# Patient Record
Sex: Female | Born: 1940 | Race: White | Hispanic: No | Smoking: Never smoker
Health system: Southern US, Community
[De-identification: ages and names within clinical notes are randomized; demographics above are authoritative.]

## PROBLEM LIST (undated history)

## (undated) DIAGNOSIS — E785 Hyperlipidemia, unspecified: Secondary | ICD-10-CM

## (undated) DIAGNOSIS — IMO0001 Reserved for inherently not codable concepts without codable children: Secondary | ICD-10-CM

## (undated) DIAGNOSIS — M549 Dorsalgia, unspecified: Secondary | ICD-10-CM

## (undated) DIAGNOSIS — R350 Frequency of micturition: Secondary | ICD-10-CM

## (undated) DIAGNOSIS — R112 Nausea with vomiting, unspecified: Secondary | ICD-10-CM

## (undated) DIAGNOSIS — M810 Age-related osteoporosis without current pathological fracture: Secondary | ICD-10-CM

## (undated) DIAGNOSIS — M1990 Unspecified osteoarthritis, unspecified site: Secondary | ICD-10-CM

## (undated) DIAGNOSIS — J189 Pneumonia, unspecified organism: Secondary | ICD-10-CM

## (undated) DIAGNOSIS — M2550 Pain in unspecified joint: Secondary | ICD-10-CM

## (undated) DIAGNOSIS — K922 Gastrointestinal hemorrhage, unspecified: Secondary | ICD-10-CM

## (undated) DIAGNOSIS — Z9889 Other specified postprocedural states: Secondary | ICD-10-CM

## (undated) DIAGNOSIS — Z8619 Personal history of other infectious and parasitic diseases: Secondary | ICD-10-CM

## (undated) DIAGNOSIS — G8929 Other chronic pain: Secondary | ICD-10-CM

## (undated) DIAGNOSIS — K219 Gastro-esophageal reflux disease without esophagitis: Secondary | ICD-10-CM

## (undated) DIAGNOSIS — R413 Other amnesia: Secondary | ICD-10-CM

## (undated) HISTORY — PX: TUBAL LIGATION: SHX77

## (undated) HISTORY — PX: COLONOSCOPY: SHX174

---

## 2004-09-28 ENCOUNTER — Encounter: Admission: RE | Admit: 2004-09-28 | Discharge: 2004-09-28 | Payer: Self-pay | Admitting: Family Medicine

## 2004-10-06 ENCOUNTER — Encounter: Admission: RE | Admit: 2004-10-06 | Discharge: 2004-10-06 | Payer: Self-pay | Admitting: Family Medicine

## 2005-09-30 ENCOUNTER — Encounter: Admission: RE | Admit: 2005-09-30 | Discharge: 2005-09-30 | Payer: Self-pay | Admitting: Family Medicine

## 2006-10-09 ENCOUNTER — Ambulatory Visit (HOSPITAL_COMMUNITY): Admission: RE | Admit: 2006-10-09 | Discharge: 2006-10-09 | Payer: Self-pay | Admitting: Gastroenterology

## 2007-08-30 ENCOUNTER — Encounter: Admission: RE | Admit: 2007-08-30 | Discharge: 2007-08-30 | Payer: Self-pay | Admitting: Family Medicine

## 2008-12-05 ENCOUNTER — Encounter: Admission: RE | Admit: 2008-12-05 | Discharge: 2008-12-05 | Payer: Self-pay | Admitting: Family Medicine

## 2010-02-02 ENCOUNTER — Encounter: Admission: RE | Admit: 2010-02-02 | Discharge: 2010-02-02 | Payer: Self-pay | Admitting: Family Medicine

## 2010-08-17 NOTE — Op Note (Signed)
Stephanie Ingram, Stephanie Ingram             ACCOUNT NO.:  [id number]   MEDICAL RECORD NO.:  [id number]          PATIENT TYPE:  AMB   LOCATION:  ENDO                         FACILITY:  [facility]   PHYSICIAN:  Anselmo Rod, M.D.  DATE OF BIRTH:  [birth year]   DATE OF PROCEDURE:  10/09/2006  DATE OF DISCHARGE:                               OPERATIVE REPORT   PROCEDURE PERFORMED:  Colorectal cancer screening.   ENDOSCOPIST:  Anselmo Rod, M.D.   INSTRUMENT USED:  Pentax video colonoscope.   INDICATIONS FOR PROCEDURE:  A 70 year old white female undergoing a  screening colonoscopy to rule out colonic polyps, masses, etc.   PREPROCEDURE PREPARATION:  Informed consent was procured from the  patient.  The patient was fasted for 8 hours prior to the procedure.  Risks and benefits of the procedure including a 10% miss rate of cancer  and polyp were discussed with the patient as well.   PREPROCEDURE PHYSICAL:  The patient had stable vital signs.  NECK: Supple.  CHEST:  Clear to auscultation.  S1, S2 regular.  ABDOMEN:  Soft with normal bowel sounds.   DESCRIPTION OF PROCEDURE:  The patient was placed in the left lateral  decubitus position and sedated with 75 mcg of fentanyl and 10 mg of  Versed given intravenously in slow incremental doses.  Once the patient  was adequately sedate and maintained on low-flow oxygen and continuous  cardiac monitoring, the Pentax video colonoscope was advanced from the  rectum to the cecum.  The appendiceal orifice and ileocecal valve were  visualized and photographed.  There was some residual stool in the right  colon.  Multiple washes were done.  Small lesions could be missed.  The  terminal ileum appeared healthy and without lesions.  There was a  evidence of sigmoid diverticulosis.  No masses or polyps were seen.  Retroflexion in the rectum revealed no abnormalities.  The patient  tolerated the procedure well without immediate complications.  Multiple  washes  were done but in spite of this, small lesions could be missed as  the patient had somewhat adherent stool in the dependent areas of the  colon.   IMPRESSION:  1. Normal colon up to the terminal ileum except for sigmoid      diverticulosis.  2. Some residual stool in the dependent areas of the colon.  Small      lesions could be missed.   RECOMMENDATIONS:  1. Continue a high-fiber diet with liberal fluid intake.  2. Brochures on diverticulosis been given to the patient for      education.  3. Repeat colonoscopy has been advised in the next 5 years.  The      patient has any abnormal symptoms in the interim, she should      contact the office immediately for further recommendations.  4. Outpatient follow-up as need arises. to the transcriptionist under      description of procedure.      Anselmo Rod, M.D.  Electronically Signed     JNM/MEDQ  D:  10/09/2006  T:  10/10/2006  Job:  [id number]  cc:   Marjory Lies, M.D.  Fax: [phone]

## 2011-02-16 ENCOUNTER — Other Ambulatory Visit: Payer: Self-pay | Admitting: Family Medicine

## 2011-02-16 DIAGNOSIS — Z1231 Encounter for screening mammogram for malignant neoplasm of breast: Secondary | ICD-10-CM

## 2011-03-14 ENCOUNTER — Ambulatory Visit
Admission: RE | Admit: 2011-03-14 | Discharge: 2011-03-14 | Disposition: A | Discharge status: Home / Self Care | Payer: Medicare Other | Source: Ambulatory Visit | Attending: Family Medicine | Admitting: Family Medicine

## 2011-03-14 DIAGNOSIS — Z1231 Encounter for screening mammogram for malignant neoplasm of breast: Secondary | ICD-10-CM

## 2012-07-24 ENCOUNTER — Other Ambulatory Visit: Payer: Self-pay | Admitting: Family Medicine

## 2012-07-24 DIAGNOSIS — M810 Age-related osteoporosis without current pathological fracture: Secondary | ICD-10-CM

## 2012-07-27 ENCOUNTER — Other Ambulatory Visit: Payer: Medicare Other

## 2013-02-04 ENCOUNTER — Other Ambulatory Visit: Payer: Self-pay | Admitting: Family Medicine

## 2013-02-04 DIAGNOSIS — Z1231 Encounter for screening mammogram for malignant neoplasm of breast: Secondary | ICD-10-CM

## 2013-03-12 ENCOUNTER — Ambulatory Visit
Admission: RE | Admit: 2013-03-12 | Discharge: 2013-03-12 | Disposition: A | Discharge status: Home / Self Care | Payer: Medicare Other | Source: Ambulatory Visit | Attending: Family Medicine | Admitting: Family Medicine

## 2013-03-12 DIAGNOSIS — M810 Age-related osteoporosis without current pathological fracture: Secondary | ICD-10-CM

## 2013-03-12 DIAGNOSIS — Z1231 Encounter for screening mammogram for malignant neoplasm of breast: Secondary | ICD-10-CM

## 2013-09-05 ENCOUNTER — Other Ambulatory Visit (HOSPITAL_COMMUNITY): Payer: Self-pay | Admitting: Orthopaedic Surgery

## 2013-09-06 ENCOUNTER — Encounter (HOSPITAL_COMMUNITY): Payer: Self-pay | Admitting: Pharmacist

## 2013-09-09 ENCOUNTER — Encounter (HOSPITAL_COMMUNITY): Payer: Self-pay

## 2013-09-09 ENCOUNTER — Ambulatory Visit (HOSPITAL_COMMUNITY)
Admission: RE | Admit: 2013-09-09 | Discharge: 2013-09-09 | Disposition: A | Discharge status: Home / Self Care | Payer: Medicare HMO | Source: Ambulatory Visit | Attending: Orthopaedic Surgery | Admitting: Orthopaedic Surgery

## 2013-09-09 ENCOUNTER — Encounter (HOSPITAL_COMMUNITY)
Admission: RE | Admit: 2013-09-09 | Discharge: 2013-09-09 | Disposition: A | Discharge status: Home / Self Care | Payer: Medicare HMO | Source: Ambulatory Visit | Attending: Orthopaedic Surgery | Admitting: Orthopaedic Surgery

## 2013-09-09 DIAGNOSIS — E785 Hyperlipidemia, unspecified: Secondary | ICD-10-CM | POA: Insufficient documentation

## 2013-09-09 DIAGNOSIS — Z01812 Encounter for preprocedural laboratory examination: Secondary | ICD-10-CM | POA: Insufficient documentation

## 2013-09-09 DIAGNOSIS — K219 Gastro-esophageal reflux disease without esophagitis: Secondary | ICD-10-CM | POA: Insufficient documentation

## 2013-09-09 DIAGNOSIS — Z01818 Encounter for other preprocedural examination: Secondary | ICD-10-CM | POA: Insufficient documentation

## 2013-09-09 DIAGNOSIS — Z01810 Encounter for preprocedural cardiovascular examination: Secondary | ICD-10-CM | POA: Insufficient documentation

## 2013-09-09 HISTORY — DX: Pneumonia, unspecified organism: J18.9

## 2013-09-09 HISTORY — DX: Other chronic pain: G89.29

## 2013-09-09 HISTORY — DX: Gastro-esophageal reflux disease without esophagitis: K21.9

## 2013-09-09 HISTORY — DX: Age-related osteoporosis without current pathological fracture: M81.0

## 2013-09-09 HISTORY — DX: Personal history of other infectious and parasitic diseases: Z86.19

## 2013-09-09 HISTORY — DX: Hyperlipidemia, unspecified: E78.5

## 2013-09-09 HISTORY — DX: Frequency of micturition: R35.0

## 2013-09-09 HISTORY — DX: Dorsalgia, unspecified: M54.9

## 2013-09-09 HISTORY — DX: Pain in unspecified joint: M25.50

## 2013-09-09 LAB — URINE MICROSCOPIC-ADD ON

## 2013-09-09 LAB — URINALYSIS, ROUTINE W REFLEX MICROSCOPIC
BILIRUBIN URINE: NEGATIVE
GLUCOSE, UA: NEGATIVE mg/dL
KETONES UR: NEGATIVE mg/dL
Nitrite: POSITIVE — AB
Protein, ur: NEGATIVE mg/dL
SPECIFIC GRAVITY, URINE: 1.018 (ref 1.005–1.030)
Urobilinogen, UA: 0.2 mg/dL (ref 0.0–1.0)
pH: 5.5 (ref 5.0–8.0)

## 2013-09-09 LAB — COMPREHENSIVE METABOLIC PANEL
ALBUMIN: 3.9 g/dL (ref 3.5–5.2)
ALT: 18 U/L (ref 0–35)
AST: 22 U/L (ref 0–37)
Alkaline Phosphatase: 43 U/L (ref 39–117)
BILIRUBIN TOTAL: 0.3 mg/dL (ref 0.3–1.2)
BUN: 14 mg/dL (ref 6–23)
CHLORIDE: 102 meq/L (ref 96–112)
CO2: 26 meq/L (ref 19–32)
Calcium: 9.6 mg/dL (ref 8.4–10.5)
Creatinine, Ser: 0.95 mg/dL (ref 0.50–1.10)
GFR calc Af Amer: 67 mL/min — ABNORMAL LOW (ref >90)
GFR, EST NON AFRICAN AMERICAN: 58 mL/min — AB (ref >90)
Glucose, Bld: 86 mg/dL (ref 70–99)
Potassium: 4.3 mEq/L (ref 3.7–5.3)
SODIUM: 141 meq/L (ref 137–147)
Total Protein: 7.5 g/dL (ref 6.0–8.3)

## 2013-09-09 LAB — CBC
HCT: 40.5 % (ref 36.0–46.0)
Hemoglobin: 13.2 g/dL (ref 12.0–15.0)
MCH: 31.1 pg (ref 26.0–34.0)
MCHC: 32.6 g/dL (ref 30.0–36.0)
MCV: 95.3 fL (ref 78.0–100.0)
PLATELETS: 280 10*3/uL (ref 150–400)
RBC: 4.25 MIL/uL (ref 3.87–5.11)
RDW: 13.8 % (ref 11.5–15.5)
WBC: 6.7 10*3/uL (ref 4.0–10.5)

## 2013-09-09 LAB — SURGICAL PCR SCREEN
MRSA, PCR: NEGATIVE
Staphylococcus aureus: NEGATIVE

## 2013-09-09 LAB — PROTIME-INR
INR: 0.96 (ref 0.00–1.49)
Prothrombin Time: 12.6 seconds (ref 11.6–15.2)

## 2013-09-09 NOTE — Pre-Procedure Instructions (Signed)
Jennelle BEILA JENERETTE  09/09/2013   Your procedure is scheduled on:  Wed,June 10 @ 12:30 PM  Report to [facility]  at 10:30 AM.  Call this number if you have problems the morning of surgery: [phone]   Remember:   Do not eat food or drink liquids after midnight.   Take these medicines the morning of surgery with A SIP OF WATER: Pain Pill(if needed)              Stop taking your Aspirin. No Goody's,BC's,Aleve,Ibuprofen,Fish Oil,or any Herbal Medications   Do not wear jewelry, make-up or nail polish.  Do not wear lotions, powders, or perfumes. You may wear deodorant.  Do not shave 48 hours prior to surgery.   Do not bring valuables to the hospital.  [facility] is not responsible                  for any belongings or valuables.               Contacts, dentures or bridgework may not be worn into surgery.  Leave suitcase in the car. After surgery it may be brought to your room.  For patients admitted to the hospital, discharge time is determined by your                treatment team.               Patients discharged the day of surgery will not be allowed to drive  home.    Special Instructions:  [facility] - Preparing for Surgery  Before surgery, you can play an important role.  Because skin is not sterile, your skin needs to be as free of germs as possible.  You can reduce the number of germs on you skin by washing with CHG (chlorahexidine gluconate) soap before surgery.  CHG is an antiseptic cleaner which kills germs and bonds with the skin to continue killing germs even after washing.  Please DO NOT use if you have an allergy to CHG or antibacterial soaps.  If your skin becomes reddened/irritated stop using the CHG and inform your nurse when you arrive at Short Stay.  Do not shave (including legs and underarms) for at least 48 hours prior to the first CHG shower.  You may shave your face.  Please follow these instructions carefully:   1.  Shower with CHG Soap the night  before surgery and the                                morning of Surgery.  2.  If you choose to wash your hair, wash your hair first as usual with your       normal shampoo.  3.  After you shampoo, rinse your hair and body thoroughly to remove the                      Shampoo.  4.  Use CHG as you would any other liquid soap.  You can apply chg directly       to the skin and wash gently with scrungie or a clean washcloth.  5.  Apply the CHG Soap to your body ONLY FROM THE NECK DOWN.        Do not use on open wounds or open sores.  Avoid contact with your eyes,       ears, mouth and genitals (  private parts).  Wash genitals (private parts)       with your normal soap.  6.  Wash thoroughly, paying special attention to the area where your surgery        will be performed.  7.  Thoroughly rinse your body with warm water from the neck down.  8.  DO NOT shower/wash with your normal soap after using and rinsing off       the CHG Soap.  9.  Pat yourself dry with a clean towel.            10.  Wear clean pajamas.            11.  Place clean sheets on your bed the night of your first shower and do not        sleep with pets.  Day of Surgery  Do not apply any lotions/deoderants the morning of surgery.  Please wear clean clothes to the hospital/surgery center.     Please read over the following fact sheets that you were given: Pain Booklet, Coughing and Deep Breathing, MRSA Information and Surgical Site Infection Prevention

## 2013-09-09 NOTE — Progress Notes (Signed)
Pt doesn't have a cardiologist  Denies ever having an echo/stress test/heart cath  Medical Md is Dr.Fred Andrey Campanile in [place]  Denies EKG or CXR in past yr

## 2013-09-10 MED ORDER — CEFAZOLIN SODIUM-DEXTROSE 2-3 GM-% IV SOLR
2.0000 g | INTRAVENOUS | Status: AC
  Administered 2013-09-11: 2 g via INTRAVENOUS
  Filled 2013-09-10: qty 50

## 2013-09-11 ENCOUNTER — Ambulatory Visit (HOSPITAL_COMMUNITY): Payer: Medicare HMO | Admitting: Critical Care Medicine

## 2013-09-11 ENCOUNTER — Ambulatory Visit (HOSPITAL_COMMUNITY): Payer: Medicare HMO

## 2013-09-11 ENCOUNTER — Encounter (HOSPITAL_COMMUNITY): Payer: Medicare HMO | Admitting: Critical Care Medicine

## 2013-09-11 ENCOUNTER — Observation Stay (HOSPITAL_COMMUNITY)
Admission: RE | Admit: 2013-09-11 | Discharge: 2013-09-12 | Disposition: A | Discharge status: Home / Self Care | Payer: Medicare HMO | Source: Ambulatory Visit | Attending: Orthopaedic Surgery | Admitting: Orthopaedic Surgery

## 2013-09-11 ENCOUNTER — Encounter (HOSPITAL_COMMUNITY)
Admission: RE | Disposition: A | Discharge status: Home / Self Care | Payer: Self-pay | Source: Ambulatory Visit | Attending: Orthopaedic Surgery

## 2013-09-11 ENCOUNTER — Encounter (HOSPITAL_COMMUNITY): Payer: Self-pay | Admitting: *Deleted

## 2013-09-11 DIAGNOSIS — E785 Hyperlipidemia, unspecified: Secondary | ICD-10-CM | POA: Insufficient documentation

## 2013-09-11 DIAGNOSIS — N390 Urinary tract infection, site not specified: Secondary | ICD-10-CM | POA: Diagnosis present

## 2013-09-11 DIAGNOSIS — M810 Age-related osteoporosis without current pathological fracture: Secondary | ICD-10-CM | POA: Insufficient documentation

## 2013-09-11 DIAGNOSIS — R350 Frequency of micturition: Secondary | ICD-10-CM | POA: Insufficient documentation

## 2013-09-11 DIAGNOSIS — K219 Gastro-esophageal reflux disease without esophagitis: Secondary | ICD-10-CM | POA: Insufficient documentation

## 2013-09-11 DIAGNOSIS — M5126 Other intervertebral disc displacement, lumbar region: Principal | ICD-10-CM | POA: Diagnosis present

## 2013-09-11 HISTORY — PX: LUMBAR LAMINECTOMY: SHX95

## 2013-09-11 SURGERY — MICRODISCECTOMY LUMBAR LAMINECTOMY
Anesthesia: General | Site: Back | Laterality: Right

## 2013-09-11 MED ORDER — MIDAZOLAM HCL 5 MG/5ML IJ SOLN
INTRAMUSCULAR | Status: DC | PRN
Start: 2013-09-11 — End: 2013-09-11
  Administered 2013-09-11: 2 mg via INTRAVENOUS

## 2013-09-11 MED ORDER — MORPHINE SULFATE 2 MG/ML IJ SOLN: 1.0000 mg | INTRAMUSCULAR | Status: DC | PRN

## 2013-09-11 MED ORDER — OXYCODONE-ACETAMINOPHEN 5-325 MG PO TABS
1.0000 | ORAL_TABLET | ORAL | Status: DC | PRN
  Administered 2013-09-11 – 2013-09-12 (×3): 2 via ORAL
  Filled 2013-09-11 (×3): qty 2

## 2013-09-11 MED ORDER — ONDANSETRON HCL 4 MG/2ML IJ SOLN: 4.0000 mg | INTRAMUSCULAR | Status: DC | PRN

## 2013-09-11 MED ORDER — SODIUM CHLORIDE 0.9 % IJ SOLN
3.0000 mL | Freq: Two times a day (BID) | INTRAMUSCULAR | Status: DC
  Administered 2013-09-11: 3 mL via INTRAVENOUS

## 2013-09-11 MED ORDER — FENTANYL CITRATE 0.05 MG/ML IJ SOLN
INTRAMUSCULAR | Status: DC | PRN
  Administered 2013-09-11: 25 ug via INTRAVENOUS
  Administered 2013-09-11 (×2): 50 ug via INTRAVENOUS

## 2013-09-11 MED ORDER — FENTANYL CITRATE 0.05 MG/ML IJ SOLN
INTRAMUSCULAR | Status: AC
  Filled 2013-09-11: qty 5

## 2013-09-11 MED ORDER — BISACODYL 10 MG RE SUPP: 10.0000 mg | Freq: Every day | RECTAL | Status: DC | PRN

## 2013-09-11 MED ORDER — FLEET ENEMA 7-19 GM/118ML RE ENEM: 1.0000 | ENEMA | Freq: Once | RECTAL | Status: AC | PRN

## 2013-09-11 MED ORDER — HYDROCODONE-ACETAMINOPHEN 5-325 MG PO TABS: 1.0000 | ORAL_TABLET | ORAL | Status: DC | PRN

## 2013-09-11 MED ORDER — MENTHOL 3 MG MT LOZG
1.0000 | LOZENGE | OROMUCOSAL | Status: DC | PRN
  Administered 2013-09-11: 3 mg via ORAL
  Filled 2013-09-11: qty 9

## 2013-09-11 MED ORDER — ASPIRIN EC 81 MG PO TBEC
81.0000 mg | DELAYED_RELEASE_TABLET | Freq: Every day | ORAL | Status: DC
  Administered 2013-09-11 – 2013-09-12 (×2): 81 mg via ORAL
  Filled 2013-09-11 (×2): qty 1

## 2013-09-11 MED ORDER — PHENYLEPHRINE HCL 10 MG/ML IJ SOLN
INTRAMUSCULAR | Status: DC | PRN
  Administered 2013-09-11 (×2): 80 ug via INTRAVENOUS
  Administered 2013-09-11: 40 ug via INTRAVENOUS
  Administered 2013-09-11 (×5): 80 ug via INTRAVENOUS

## 2013-09-11 MED ORDER — OXYCODONE HCL 5 MG/5ML PO SOLN: 5.0000 mg | Freq: Once | ORAL | Status: DC | PRN

## 2013-09-11 MED ORDER — FENOFIBRATE 160 MG PO TABS
160.0000 mg | ORAL_TABLET | Freq: Every day | ORAL | Status: DC
  Administered 2013-09-11 – 2013-09-12 (×2): 160 mg via ORAL
  Filled 2013-09-11 (×2): qty 1

## 2013-09-11 MED ORDER — HYDROMORPHONE HCL PF 1 MG/ML IJ SOLN
0.2500 mg | INTRAMUSCULAR | Status: DC | PRN
  Administered 2013-09-11 (×2): 0.25 mg via INTRAVENOUS
  Administered 2013-09-11: 0.5 mg via INTRAVENOUS

## 2013-09-11 MED ORDER — SUCCINYLCHOLINE CHLORIDE 20 MG/ML IJ SOLN
INTRAMUSCULAR | Status: AC
  Filled 2013-09-11: qty 1

## 2013-09-11 MED ORDER — PROMETHAZINE HCL 25 MG/ML IJ SOLN: 6.2500 mg | INTRAMUSCULAR | Status: DC | PRN

## 2013-09-11 MED ORDER — METHOCARBAMOL 1000 MG/10ML IJ SOLN
500.0000 mg | Freq: Four times a day (QID) | INTRAMUSCULAR | Status: DC | PRN
  Filled 2013-09-11: qty 5

## 2013-09-11 MED ORDER — GLYCOPYRROLATE 0.2 MG/ML IJ SOLN
INTRAMUSCULAR | Status: AC
  Filled 2013-09-11: qty 2

## 2013-09-11 MED ORDER — ARTIFICIAL TEARS OP OINT
TOPICAL_OINTMENT | OPHTHALMIC | Status: DC | PRN
  Administered 2013-09-11: 1 via OPHTHALMIC

## 2013-09-11 MED ORDER — OXYCODONE HCL 5 MG PO TABS: 5.0000 mg | ORAL_TABLET | Freq: Once | ORAL | Status: DC | PRN

## 2013-09-11 MED ORDER — PHENOL 1.4 % MT LIQD: 1.0000 | OROMUCOSAL | Status: DC | PRN

## 2013-09-11 MED ORDER — NEOSTIGMINE METHYLSULFATE 10 MG/10ML IV SOLN
INTRAVENOUS | Status: DC | PRN
  Administered 2013-09-11: 3 mg via INTRAVENOUS

## 2013-09-11 MED ORDER — HYDROMORPHONE HCL PF 1 MG/ML IJ SOLN
INTRAMUSCULAR | Status: AC
  Administered 2013-09-11: 0.25 mg via INTRAVENOUS
  Filled 2013-09-11: qty 1

## 2013-09-11 MED ORDER — ACETAMINOPHEN 650 MG RE SUPP: 650.0000 mg | RECTAL | Status: DC | PRN

## 2013-09-11 MED ORDER — MIDAZOLAM HCL 2 MG/2ML IJ SOLN
INTRAMUSCULAR | Status: AC
  Filled 2013-09-11: qty 2

## 2013-09-11 MED ORDER — GLYCOPYRROLATE 0.2 MG/ML IJ SOLN
INTRAMUSCULAR | Status: DC | PRN
  Administered 2013-09-11: 0.4 mg via INTRAVENOUS

## 2013-09-11 MED ORDER — PROPOFOL 10 MG/ML IV BOLUS
INTRAVENOUS | Status: AC
  Filled 2013-09-11: qty 20

## 2013-09-11 MED ORDER — PHENYLEPHRINE 40 MCG/ML (10ML) SYRINGE FOR IV PUSH (FOR BLOOD PRESSURE SUPPORT)
PREFILLED_SYRINGE | INTRAVENOUS | Status: AC
  Filled 2013-09-11: qty 10

## 2013-09-11 MED ORDER — BUPIVACAINE HCL (PF) 0.25 % IJ SOLN
INTRAMUSCULAR | Status: AC
  Filled 2013-09-11: qty 30

## 2013-09-11 MED ORDER — LIDOCAINE HCL (CARDIAC) 20 MG/ML IV SOLN
INTRAVENOUS | Status: DC | PRN
  Administered 2013-09-11: 80 mg via INTRAVENOUS

## 2013-09-11 MED ORDER — KETOROLAC TROMETHAMINE 15 MG/ML IJ SOLN
INTRAMUSCULAR | Status: AC
  Administered 2013-09-11: 15 mg
  Filled 2013-09-11: qty 1

## 2013-09-11 MED ORDER — ONDANSETRON HCL 4 MG/2ML IJ SOLN
INTRAMUSCULAR | Status: DC | PRN
  Administered 2013-09-11: 4 mg via INTRAVENOUS

## 2013-09-11 MED ORDER — LACTATED RINGERS IV SOLN
INTRAVENOUS | Status: DC
  Administered 2013-09-11 (×2): via INTRAVENOUS

## 2013-09-11 MED ORDER — KETOROLAC TROMETHAMINE 30 MG/ML IJ SOLN
15.0000 mg | Freq: Four times a day (QID) | INTRAMUSCULAR | Status: DC
  Administered 2013-09-11 – 2013-09-12 (×3): 15 mg via INTRAVENOUS
  Filled 2013-09-11 (×3): qty 1

## 2013-09-11 MED ORDER — 0.9 % SODIUM CHLORIDE (POUR BTL) OPTIME
TOPICAL | Status: DC | PRN
  Administered 2013-09-11: 1000 mL

## 2013-09-11 MED ORDER — OXYCODONE-ACETAMINOPHEN 5-325 MG PO TABS: 1.0000 | ORAL_TABLET | ORAL | Status: DC | PRN

## 2013-09-11 MED ORDER — ACETAMINOPHEN 325 MG PO TABS: 650.0000 mg | ORAL_TABLET | ORAL | Status: DC | PRN

## 2013-09-11 MED ORDER — DOCUSATE SODIUM 100 MG PO CAPS
100.0000 mg | ORAL_CAPSULE | Freq: Two times a day (BID) | ORAL | Status: DC
  Administered 2013-09-11 – 2013-09-12 (×2): 100 mg via ORAL
  Filled 2013-09-11 (×3): qty 1

## 2013-09-11 MED ORDER — PHENYLEPHRINE HCL 10 MG/ML IJ SOLN
10.0000 mg | INTRAVENOUS | Status: DC | PRN
  Administered 2013-09-11: 20 ug/min via INTRAVENOUS

## 2013-09-11 MED ORDER — LIDOCAINE HCL (CARDIAC) 20 MG/ML IV SOLN
INTRAVENOUS | Status: AC
  Filled 2013-09-11: qty 5

## 2013-09-11 MED ORDER — ROCURONIUM BROMIDE 100 MG/10ML IV SOLN
INTRAVENOUS | Status: DC | PRN
  Administered 2013-09-11: 40 mg via INTRAVENOUS

## 2013-09-11 MED ORDER — NEOSTIGMINE METHYLSULFATE 10 MG/10ML IV SOLN
INTRAVENOUS | Status: AC
  Filled 2013-09-11: qty 1

## 2013-09-11 MED ORDER — SENNOSIDES-DOCUSATE SODIUM 8.6-50 MG PO TABS: 1.0000 | ORAL_TABLET | Freq: Every evening | ORAL | Status: DC | PRN

## 2013-09-11 MED ORDER — BUPIVACAINE HCL (PF) 0.25 % IJ SOLN
INTRAMUSCULAR | Status: DC | PRN
  Administered 2013-09-11: 10 mL

## 2013-09-11 MED ORDER — METHOCARBAMOL 500 MG PO TABS
500.0000 mg | ORAL_TABLET | Freq: Four times a day (QID) | ORAL | Status: DC | PRN
  Administered 2013-09-11: 500 mg via ORAL

## 2013-09-11 MED ORDER — SODIUM CHLORIDE 0.9 % IV SOLN: 250.0000 mL | INTRAVENOUS | Status: DC

## 2013-09-11 MED ORDER — KCL IN DEXTROSE-NACL 20-5-0.45 MEQ/L-%-% IV SOLN
INTRAVENOUS | Status: DC
  Administered 2013-09-11: 19:00:00 via INTRAVENOUS
  Filled 2013-09-11 (×3): qty 1000

## 2013-09-11 MED ORDER — SULFAMETHOXAZOLE-TMP DS 800-160 MG PO TABS
1.0000 | ORAL_TABLET | Freq: Two times a day (BID) | ORAL | Status: DC
  Administered 2013-09-11: 1 via ORAL
  Filled 2013-09-11 (×3): qty 1

## 2013-09-11 MED ORDER — PROPOFOL 10 MG/ML IV BOLUS
INTRAVENOUS | Status: DC | PRN
  Administered 2013-09-11: 130 mg via INTRAVENOUS

## 2013-09-11 MED ORDER — ONDANSETRON HCL 4 MG/2ML IJ SOLN
INTRAMUSCULAR | Status: AC
  Filled 2013-09-11: qty 2

## 2013-09-11 MED ORDER — METHOCARBAMOL 500 MG PO TABS
ORAL_TABLET | ORAL | Status: AC
  Filled 2013-09-11: qty 1

## 2013-09-11 MED ORDER — SODIUM CHLORIDE 0.9 % IJ SOLN: 3.0000 mL | INTRAMUSCULAR | Status: DC | PRN

## 2013-09-11 SURGICAL SUPPLY — 50 items
ADH SKN CLS APL DERMABOND .7 (GAUZE/BANDAGES/DRESSINGS) ×1
ADH SKN CLS LQ APL DERMABOND (GAUZE/BANDAGES/DRESSINGS) ×1
BUR ROUND FLUTED 4 SOFT TCH (BURR) IMPLANT
BUR ROUND FLUTED 4MM SOFT TCH (BURR)
CORDS BIPOLAR (ELECTRODE) ×3 IMPLANT
COVER SURGICAL LIGHT HANDLE (MISCELLANEOUS) ×3 IMPLANT
DERMABOND ADHESIVE PROPEN (GAUZE/BANDAGES/DRESSINGS) ×2
DERMABOND ADVANCED (GAUZE/BANDAGES/DRESSINGS) ×2
DERMABOND ADVANCED .7 DNX12 (GAUZE/BANDAGES/DRESSINGS) ×1 IMPLANT
DERMABOND ADVANCED .7 DNX6 (GAUZE/BANDAGES/DRESSINGS) IMPLANT
DRAPE MICROSCOPE LEICA (MISCELLANEOUS) ×3 IMPLANT
DRAPE PROXIMA HALF (DRAPES) ×6 IMPLANT
DRSG EMULSION OIL 3X3 NADH (GAUZE/BANDAGES/DRESSINGS) ×3 IMPLANT
DRSG MEPILEX BORDER 4X4 (GAUZE/BANDAGES/DRESSINGS) ×3 IMPLANT
DRSG MEPILEX BORDER 4X8 (GAUZE/BANDAGES/DRESSINGS) ×3 IMPLANT
DURAPREP 26ML APPLICATOR (WOUND CARE) ×3 IMPLANT
ELECT REM PT RETURN 9FT ADLT (ELECTROSURGICAL) ×3
ELECTRODE REM PT RTRN 9FT ADLT (ELECTROSURGICAL) ×1 IMPLANT
GLOVE BIOGEL PI IND STRL 7.5 (GLOVE) ×1 IMPLANT
GLOVE BIOGEL PI IND STRL 8 (GLOVE) ×1 IMPLANT
GLOVE BIOGEL PI INDICATOR 7.5 (GLOVE) ×2
GLOVE BIOGEL PI INDICATOR 8 (GLOVE) ×2
GLOVE ECLIPSE 7.0 STRL STRAW (GLOVE) ×3 IMPLANT
GLOVE ORTHO TXT STRL SZ7.5 (GLOVE) ×3 IMPLANT
GOWN STRL REUS W/ TWL LRG LVL3 (GOWN DISPOSABLE) ×3 IMPLANT
GOWN STRL REUS W/TWL LRG LVL3 (GOWN DISPOSABLE) ×9
KIT BASIN OR (CUSTOM PROCEDURE TRAY) ×3 IMPLANT
KIT ROOM TURNOVER OR (KITS) ×3 IMPLANT
MANIFOLD NEPTUNE II (INSTRUMENTS) ×3 IMPLANT
NDL SPNL 18GX3.5 QUINCKE PK (NEEDLE) ×1 IMPLANT
NDL SUT .5 MAYO 1.404X.05X (NEEDLE) ×1 IMPLANT
NEEDLE 22X1 1/2 (OR ONLY) (NEEDLE) ×3 IMPLANT
NEEDLE MAYO TAPER (NEEDLE) ×3
NEEDLE SPNL 18GX3.5 QUINCKE PK (NEEDLE) ×3 IMPLANT
NS IRRIG 1000ML POUR BTL (IV SOLUTION) ×3 IMPLANT
PACK LAMINECTOMY ORTHO (CUSTOM PROCEDURE TRAY) ×3 IMPLANT
PAD ARMBOARD 7.5X6 YLW CONV (MISCELLANEOUS) ×6 IMPLANT
PATTIES SURGICAL .5 X.5 (GAUZE/BANDAGES/DRESSINGS) ×3 IMPLANT
PATTIES SURGICAL .75X.75 (GAUZE/BANDAGES/DRESSINGS) IMPLANT
SPONGE GAUZE 4X4 12PLY (GAUZE/BANDAGES/DRESSINGS) ×3 IMPLANT
SUT VIC AB 2-0 CT1 27 (SUTURE) ×3
SUT VIC AB 2-0 CT1 TAPERPNT 27 (SUTURE) ×1 IMPLANT
SUT VICRYL 0 TIES 12 18 (SUTURE) ×3 IMPLANT
SUT VICRYL 4-0 PS2 18IN ABS (SUTURE) IMPLANT
SUT VICRYL AB 2 0 TIES (SUTURE) ×3 IMPLANT
SYR 20ML ECCENTRIC (SYRINGE) IMPLANT
SYR CONTROL 10ML LL (SYRINGE) ×3 IMPLANT
TOWEL OR 17X24 6PK STRL BLUE (TOWEL DISPOSABLE) ×3 IMPLANT
TOWEL OR 17X26 10 PK STRL BLUE (TOWEL DISPOSABLE) ×3 IMPLANT
WATER STERILE IRR 1000ML POUR (IV SOLUTION) ×3 IMPLANT

## 2013-09-11 NOTE — H&P (Signed)
[facility]   A Division of [employer] [facility]   [address] [place], [place] [address] Telephone: [phone]  Fax: [phone]     PATIENT: Stephanie Ingram, Stephanie Ingram   MR#: [id number]  DOB: 08-06-1940      The patient states her back pain is significantly increased.  She had an epidural.  It did not help like it did last fall when she got good relief up until March.  She states her pain is severe.  She cannot sleep.  She is having trouble standing up.  She cannot walk.  She is having trouble with activities of daily living.  She has to sit crooked when she is in a chair and lean over onto her left hip.  She has back pain into her sciatic notch that radiates down to her knee, occasionally past her knee into her calf laterally but mostly in the thigh region with decreased sensation.  She has had a Medrol Dosepak in the past, most recently without relief.  Epidural has not been effective.  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  She does have significant scoliosis.  She had multilevel changes with some disk degeneration and some narrowing, none previously that were severe.    She has had persistent problems with increasing back pain, leg pain and states the last 3 months have been excruciating.  She has to hang onto the rail when she goes up and down stairs.  She has had some giving way, weakness in her right quad.     ALLERGIES:   No known drug allergies.     PAST SURGICAL HISTORY:   She has never had any surgeries before.   FAMILY HISTORY:   Positive for diabetes, heart disease.  She had multiple uncles who died of cancer, type unknown.  She also lost a brother to cancer.   SOCIAL HISTORY:   The patient is divorced, retired.  Does not smoke or drink.   REVIEW OF SYSTEMS:   Positive for arthritis, low back pain, cardiac disease, osteoporosis and rheumatic fever as a child.     PHYSICAL EXAMINATION:  The patient is 5  feet 5 inches, 169 pounds, alert and oriented, WD, WN, NAD.   Extraocular movements intact.  The patient is in mild distress as long as she sits and leans to the left.  She has pain with walking, pain with standing, pain that radiates from her back and her posterolateral thigh sometimes anteriorly in the thigh and occasionally down past her knee. The patient has no supraclavicular lymphadenopathy.  No rash over exposed skin.  Pulse is regular.  No murmur.  Lungs are clear.  No abdominal tenderness.  The patient has scoliosis, pelvic obliquity right side higher than left.  Distal pulses are 2+ and symmetric, DP, posterior tib, 2+ knee jerk on the left, 1+ on the right.  Ankle jerk is 1+ and symmetric.  She has giving way quad weakness.  It can be overcome with arm strength.  No hip abduction weakness.  No adduction weakness.  Anterior tib, gastroc soleus, peroneals are strong.     RADIOGRAPHS/TEST:   MRI scan is reviewed which shows the large extruded fragment at L2-3 on the right with caudal migration.  She does have significant scoliosis with 8 mm lateral listhesis to the left at L3-4, 1 cm endplate spurring at L2-3 without lateral listhesis.     We discussed the plan, reviewed the MRI scan. She  does have some moderate stenosis, but is not having any claudication symptoms with narrowing at L4-5 which is unchanged.  She is having persistent symptoms now failure to respond to epidurals.     PLAN:  The plan would be right L2-3 microdiscectomy, removal of fragment.  She will need to have a laminotomy and lateral recess decompression due to overhanging spurs.  All questions answered.  She understands and request we proceed.  The patient has no spondylolisthesis at the L2-3 level.  Procedure discussed.  Risk of dural tear, dural repair, reoperation, need for later fusion, potential for disk rerupture rate quoted at 5%.  All questions answered.  She request we proceed.         Mark C. Ophelia CharterYates, M.D.     Auto-Authenticated by Veverly FellsMark C. Ophelia CharterYates, M.D.

## 2013-09-11 NOTE — Discharge Instructions (Signed)
No lifting greater than 10 lbs. Avoid bending, stooping and twisting. Walk in house for first week them may start to get out slowly increasing distance up to one mile by 3 weeks post op. Keep incision dry for 3 days, may use tegaderm or similar water impervious dressing. Ice to back daily for pain and swelling

## 2013-09-11 NOTE — Anesthesia Postprocedure Evaluation (Signed)
  Anesthesia Post-op Note  Patient: Stephanie Ingram  Procedure(s) Performed: Procedure(s) with comments: MICRODISCECTOMY LUMBAR LAMINECTOMY (Right) - Right L2-3 Microdiscectomy  Patient Location: PACU  Anesthesia Type:General  Level of Consciousness: awake, alert  and oriented  Airway and Oxygen Therapy: Patient Spontanous Breathing and Patient connected to nasal cannula oxygen  Post-op Pain: mild  Post-op Assessment: Post-op Vital signs reviewed, Patient's Cardiovascular Status Stable, Respiratory Function Stable, Patent Airway and Pain level controlled  Post-op Vital Signs: stable  Last Vitals:  Filed Vitals:   09/11/13 1615  BP: 119/63  Pulse: 64  Temp: 36.9 C  Resp: 17    Complications: No apparent anesthesia complications

## 2013-09-11 NOTE — Anesthesia Preprocedure Evaluation (Addendum)
Anesthesia Evaluation  Patient identified by MRN, date of birth, ID band Patient awake    Reviewed: Allergy & Precautions, H&P , NPO status , Patient's Chart, lab work & pertinent test results  Airway Mallampati: I  Neck ROM: Full    Dental  (+) Dental Advisory Given, Lower Dentures, Upper Dentures   Pulmonary  breath sounds clear to auscultation        Cardiovascular negative cardio ROS  Rhythm:Regular Rate:Normal     Neuro/Psych  Headaches,    GI/Hepatic GERD-  ,  Endo/Other    Renal/GU      Musculoskeletal   Abdominal   Peds  Hematology   Anesthesia Other Findings   Reproductive/Obstetrics                          Anesthesia Physical Anesthesia Plan  ASA: II  Anesthesia Plan: General   Post-op Pain Management:    Induction: Intravenous  Airway Management Planned: Oral ETT  Additional Equipment:   Intra-op Plan:   Post-operative Plan: Extubation in OR  Informed Consent: I have reviewed the patients History and Physical, chart, labs and discussed the procedure including the risks, benefits and alternatives for the proposed anesthesia with the patient or authorized representative who has indicated his/her understanding and acceptance.   Dental advisory given  Plan Discussed with: Anesthesiologist, Surgeon and CRNA  Anesthesia Plan Comments:        Anesthesia Quick Evaluation

## 2013-09-11 NOTE — Anesthesia Procedure Notes (Signed)
Procedure Name: Intubation Date/Time: 09/11/2013 12:54 PM Performed by: Elon Alas Pre-anesthesia Checklist: Patient identified, Emergency Drugs available, Timeout performed, Suction available and Patient being monitored Patient Re-evaluated:Patient Re-evaluated prior to inductionOxygen Delivery Method: Circle system utilized Preoxygenation: Pre-oxygenation with 100% oxygen Intubation Type: IV induction Ventilation: Mask ventilation without difficulty Laryngoscope Size: Mac and 3 Grade View: Grade II Tube size: 7.0 mm Number of attempts: 1 Airway Equipment and Method: Stylet Placement Confirmation: positive ETCO2,  CO2 detector,  ETT inserted through vocal cords under direct vision and breath sounds checked- equal and bilateral Secured at: 22 cm Tube secured with: Tape Dental Injury: Teeth and Oropharynx as per pre-operative assessment

## 2013-09-11 NOTE — Anesthesia Postprocedure Evaluation (Signed)
  Anesthesia Post-op Note  Patient: Stephanie Ingram  Procedure(s) Performed: Procedure(s) with comments: MICRODISCECTOMY LUMBAR LAMINECTOMY (Right) - Right L2-3 Microdiscectomy  Patient Location: PACU  Anesthesia Type:General  Level of Consciousness: awake  Airway and Oxygen Therapy: Patient Spontanous Breathing  Post-op Pain: mild  Post-op Assessment: Post-op Vital signs reviewed  Post-op Vital Signs: Reviewed  Last Vitals:  Filed Vitals:   09/11/13 1615  BP: 119/63  Pulse: 64  Temp:   Resp: 17    Complications: No apparent anesthesia complications

## 2013-09-11 NOTE — Transfer of Care (Signed)
Immediate Anesthesia Transfer of Care Note  Patient: Stephanie Ingram  Procedure(s) Performed: Procedure(s) with comments: MICRODISCECTOMY LUMBAR LAMINECTOMY (Right) - Right L2-3 Microdiscectomy  Patient Location: PACU  Anesthesia Type:General  Level of Consciousness: awake and alert   Airway & Oxygen Therapy: Patient Spontanous Breathing and Patient connected to nasal cannula oxygen  Post-op Assessment: Report given to PACU RN, Post -op Vital signs reviewed and stable and Patient moving all extremities X 4  Post vital signs: Reviewed and stable  Complications: No apparent anesthesia complications

## 2013-09-11 NOTE — Interval H&P Note (Signed)
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 After consideration of risks, benefits and other options for treatment, the patient has consented to  Procedure(s) with comments: MICRODISCECTOMY LUMBAR LAMINECTOMY (Right) - Right L2-3 Microdiscectomy as a surgical intervention .  The patient's history has been reviewed, patient examined, no change in status, stable for surgery.  I have reviewed the patient's chart and labs.  Questions were answered to the patient's satisfaction.     Batool Majid C

## 2013-09-11 NOTE — Brief Op Note (Cosign Needed)
09/11/2013  2:41 PM  PATIENT:  Stephanie Ingram  73 y.o. 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  PROCEDURE:  Procedure(s) with comments: MICRODISCECTOMY LUMBAR LAMINECTOMY (Right) - Right L2-3 Microdiscectomy  SURGEON:  Surgeon(s) and Role:    * Eldred Manges, MD - Primary  PHYSICIAN ASSISTANT:Renelda Kilian VernonPAC   ASSISTANTS: none   ANESTHESIA:   general  EBL:  Total I/O In: 1400 [I.V.:1400] Out: 50 [Blood:50]  BLOOD ADMINISTERED:none  DRAINS: none   LOCAL MEDICATIONS USED:  MARCAINE     SPECIMEN:  No Specimen  DISPOSITION OF SPECIMEN:  N/A  COUNTS:  YES  TOURNIQUET:  * No tourniquets in log *  DICTATION: .Note written in EPIC  PLAN OF CARE: Admit for overnight observation  PATIENT DISPOSITION:  PACU - hemodynamically stable.   Delay start of Pharmacological VTE agent (>24hrs) due to surgical blood loss or risk of bleeding: yes

## 2013-09-12 ENCOUNTER — Encounter (HOSPITAL_COMMUNITY): Payer: Self-pay | Admitting: Orthopaedic Surgery

## 2013-09-12 NOTE — Progress Notes (Signed)
Patient has been ambulating to and from the bathroom with stand-by assist.  She is moving very well and has small to moderate amount of discomfort.

## 2013-09-12 NOTE — Progress Notes (Signed)
Subjective: 1 Day Post-Op Procedure(s) (LRB): MICRODISCECTOMY LUMBAR LAMINECTOMY (Right) Patient reports pain as mild. My pain is a lot better.  My leg feels so much better.   Objective: Vital signs in last 24 hours: Temp:  [97.9 F (36.6 C)-98.7 F (37.1 C)] 97.9 F (36.6 C) (06/11 0550) Pulse Rate:  [61-77] 66 (06/11 0550) Resp:  [12-20] 16 (06/11 0550) BP: (111-133)/(46-86) 117/59 mmHg (06/11 0550) SpO2:  [96 %-100 %] 99 % (06/11 0550) Weight:  [71.668 kg (158 lb)] 71.668 kg (158 lb) (06/10 1030)  Intake/Output from previous day: 06/10 0701 - 06/11 0700 In: 1400 [I.V.:1400] Out: 50 [Blood:50] Intake/Output this shift:     Recent Labs  09/09/13 1444  HGB 13.2    Recent Labs  09/09/13 1444  WBC 6.7  RBC 4.25  HCT 40.5  PLT 280    Recent Labs  09/09/13 1444  NA 141  K 4.3  CL 102  CO2 26  BUN 14  CREATININE 0.95  GLUCOSE 86  CALCIUM 9.6    Recent Labs  09/09/13 1444  INR 0.96    Neurologically intact  Assessment/Plan: 1 Day Post-Op Procedure(s) (LRB): MICRODISCECTOMY LUMBAR LAMINECTOMY (Right) Plan :   Discharge home today  Stephanie Ingram C 09/12/2013, 8:21 AM

## 2013-09-12 NOTE — Op Note (Signed)
NAMERYSTAL, HOLT             ACCOUNT NO.:  [id number]  MEDICAL RECORD NO.:  [id number]  LOCATION:  5N11C                        FACILITY:  [facility]  PHYSICIAN:  Farida Mcreynolds C. Ophelia Charter, M.D.    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  PROCEDURE:  Right L2-3 microdiskectomy, removal of free fragment, and foraminotomy.  SURGEON:  Holton Sidman C. Ophelia Charter, M.D.  ASSISTANT:  Maud Deed, PA-C.medically necessary and present for the entire procedure.   ANESTHESIA:  GOT.  COMPLICATIONS:  None.  ESTIMATED BLOOD LOSS:  Minimal.  INDICATIONS:  A 73 year old female with persistent problems with leg pain and weakness.  MRI which had been persistently documenting a free fragment inferior to L2-3 on the right side with vacuum-associated cavity.  She has failed conservative treatment including epidural steroids.  Back was prepped with DuraPrep.  The patient was in supine position after orotracheal intubation.  Ancef was given prophylactically.  The patient has had a history of UTIs and preoperative urinalysis showed evidence of likely UTI, and she had been started on Septra yesterday DS 1 p.o. b.i.d.  Ancef was given for prophylaxis and the patient's PCR was negative.  The area was squared with towels, laminectomy sheet drapes, Betadine, Steri-Drape application.  Needle localization with spinal needle over the palpable expected position for L2-3, which was confirmed by lateral radiograph.  After time-out procedure, incision was made, laminotomy was performed.  A short Taylor retractor was originally 5- pound weight, but which tended to angle the patient and this was switched to a 2-pound weight.  Bur was used to thin the  lamina.  The anterior aspect of the laminar ventral aspect, which was next to the dura, which was carefully removed.  There was extremely thin ligamentum which after being probed using the operative microscope that was draped and removed with a 2 and 3 mm Kerrison.  Following down to the disk space, there was spur present and with retrolisthesis noted.  Small amount of disk material was present in the sac, some passes were made, slimy pieces of disk were removed.  Passes were made with micropituitary into the disk space.  Ball-tipped nerve hook, hockey stick, black nerve hook was used to make sure there was no fragment adherent to the dura on the undersurface.  Foramina was enlarged, and once the decompression, removal of the fragment was performed, nerve root was free.  Nerve to be passed above and below with hockey stick.  A Penfield too was placed in the cavity where the fragment had been localized and repeat x-ray was taken to confirm appropriate position and level. Irrigation with saline solution.  Final passes anterior to the dura, there was increased freedom of the dura, dura was intact.  Zero Vicryl was used for closing fascia, 2-0 Vicryl subcutaneous tissue, subcuticular skin closure, postop dressing, and transferred to the recovery room in a stable condition.  Dermabond was applied to the skin.     Bereket Gernert C. Ophelia Charter, M.D.  MCY/MEDQ  D:  09/11/2013  T:  09/12/2013  Job:  [id number]

## 2013-09-16 NOTE — Discharge Summary (Signed)
Physician Discharge Summary  Patient ID: Stephanie Ingram MRN: [id number] DOB/AGE: 75-Sep-1942 75 y.o.  Admit date: 09/11/2013 Discharge date: 09/12/2013  Admission Diagnoses:  HNP (herniated nucleus pulposus), lumbar right L2-3  Discharge Diagnoses:  Principal Problem:   HNP (herniated nucleus pulposus), lumbar Active Problems:   UTI (urinary tract infection)   Past Medical History  Diagnosis Date  . Hyperlipidemia     takes Tricor daily  . Pneumonia 10+yrs ago    hx of  . Headache(784.0)     occasionally  . Joint pain   . Osteoporosis   . Chronic back pain     HNP  . GERD (gastroesophageal reflux disease)     occasionally but doesn't take any meds  . Urinary frequency   . History of shingles     Surgeries: Procedure(s): MICRODISCECTOMY LUMBAR LAMINECTOMY right L2-3 on 09/11/2013   Consultants (if any):  none  Discharged Condition: Improved  Hospital Course: Stephanie Ingram is an 73 y.o. female who was admitted 09/11/2013 with a diagnosis of HNP (herniated nucleus pulposus), lumbar and went to the operating room on 09/11/2013 and underwent the above named procedures.    She was given perioperative antibiotics:  Anti-infectives   Start     Dose/Rate Route Frequency Ordered Stop   09/11/13 2200  sulfamethoxazole-trimethoprim (BACTRIM DS) 800-160 MG per tablet 1 tablet  Status:  Discontinued     1 tablet Oral Every 12 hours 09/11/13 1651 09/12/13 1231   09/11/13 0600  ceFAZolin (ANCEF) IVPB 2 g/50 mL premix     2 g 100 mL/hr over 30 Minutes Intravenous On call to O.R. 09/10/13 1409 09/11/13 1257    .  She was given sequential compression devices, early ambulation for DVT prophylaxis.  She benefited maximally from the hospital stay and there were no complications.    Recent vital signs:  Filed Vitals:   09/12/13 0550  BP: 117/59  Pulse: 66  Temp: 97.9 F (36.6 C)  Resp: 16    Recent laboratory studies:  Lab Results  Component Value Date   HGB 13.2  09/09/2013   Lab Results  Component Value Date   WBC 6.7 09/09/2013   PLT 280 09/09/2013   Lab Results  Component Value Date   INR 0.96 09/09/2013   Lab Results  Component Value Date   NA 141 09/09/2013   K 4.3 09/09/2013   CL 102 09/09/2013   CO2 26 09/09/2013   BUN 14 09/09/2013   CREATININE 0.95 09/09/2013   GLUCOSE 86 09/09/2013    Discharge Medications:     Medication List    STOP taking these medications       HYDROcodone-acetaminophen 5-325 MG per tablet  Commonly known as:  NORCO/VICODIN      TAKE these medications       aspirin EC 81 MG tablet  Take 81 mg by mouth daily.     fenofibrate 145 MG tablet  Commonly known as:  TRICOR  Take 145 mg by mouth daily.     oxyCODONE-acetaminophen 5-325 MG per tablet  Commonly known as:  ROXICET  Take 1-2 tablets by mouth every 4 (four) hours as needed.     risedronate 150 MG tablet  Commonly known as:  ACTONEL  Take 150 mg by mouth every 30 (thirty) days. with water on empty stomach, nothing by mouth or lie down for next 30 minutes.        Diagnostic Studies: Dg Chest 2 View  09/09/2013   CLINICAL DATA:  Preoperative evaluation for lumbar spine surgery, past history of hyperlipidemia, pneumonia, GERD  EXAM: CHEST  2 VIEW  COMPARISON:  None  FINDINGS: Normal heart size, mediastinal contours, and pulmonary vascularity.  Minimal bronchitic changes without infiltrate, pleural effusion or pneumothorax.  Bones appear demineralized with biconvex thoracic scoliosis.  IMPRESSION: Minimal bronchitic changes.   Electronically Signed   By: Ulyses SouthwardMark  Boles M.D.   On: 09/09/2013 15:08   Dg Lumbar Spine 2-3 Views  09/11/2013   CLINICAL DATA:  L2-3 microdiscectomy.  EXAM: LUMBAR SPINE - 1 VIEW  COMPARISON:  09/11/2013  FINDINGS: Tissue spreaders at L2-3. Surgical instrument has been advanced and now overlies the L3 vertebral body and posterior disc space at L2-3. No acute bony change. Disc degeneration at L2-3.  IMPRESSION: L2-3 disc space is localized in  the operating room.   Electronically Signed   By: Marlan Palauharles  Clark M.D.   On: 09/11/2013 15:16   Dg Lumbar Spine 1 View  09/11/2013   CLINICAL DATA:  Microdiskectomy L2-3  EXAM: LUMBAR SPINE - 1 VIEW  COMPARISON:  Lumbar MRI 08/24/2013  FINDINGS: Two images were obtained in the operating room.  Image number 1 reveals a needle between the spinous processes of L2 and L3.  Image number 2 reveals tissue spreaders posteriorly at L2-3. Instrument is located overlying the lamina of L2.  IMPRESSION: L2-3 localized.  These results were called by telephone at the time of interpretation on 09/11/2013 at 1:45 PM to the OR , who verbally acknowledged these results.   Electronically Signed   By: Marlan Palauharles  Clark M.D.   On: 09/11/2013 13:46    Disposition: 01-Home or Self Care  DISCHARGE INSTRUCTIONS: No lifting greater than 10 lbs. Avoid bending, stooping and twisting. Walk in house for first week them may start to get out slowly increasing distance up to one mile by 3 weeks post op. Keep incision dry for 3 days, may use tegaderm or similar water impervious dressing.        Follow-up Information   Follow up with Eldred MangesYATES,MARK C, MD. Schedule an appointment as soon as possible for a visit in 2 weeks.   Specialty:  Orthopedic Surgery   Contact information:   [address] [place] [address] [phone]        Signed: Wende NeighborsVERNON,Latiqua Daloia M 09/16/2013, 1:08 PM

## 2014-05-13 ENCOUNTER — Other Ambulatory Visit: Payer: Self-pay

## 2014-05-13 DIAGNOSIS — Z1231 Encounter for screening mammogram for malignant neoplasm of breast: Secondary | ICD-10-CM

## 2014-05-27 ENCOUNTER — Ambulatory Visit
Admission: RE | Admit: 2014-05-27 | Discharge: 2014-05-27 | Disposition: A | Discharge status: Home / Self Care | Payer: Medicare HMO | Source: Ambulatory Visit

## 2014-05-27 DIAGNOSIS — Z1231 Encounter for screening mammogram for malignant neoplasm of breast: Secondary | ICD-10-CM

## 2015-05-07 ENCOUNTER — Observation Stay (HOSPITAL_COMMUNITY)
Admission: EM | Admit: 2015-05-07 | Discharge: 2015-05-08 | Disposition: A | Discharge status: Home / Self Care | Payer: Medicare HMO | Attending: Internal Medicine | Admitting: Internal Medicine

## 2015-05-07 ENCOUNTER — Encounter (HOSPITAL_COMMUNITY): Payer: Self-pay | Admitting: Emergency Medicine

## 2015-05-07 DIAGNOSIS — K5790 Diverticulosis of intestine, part unspecified, without perforation or abscess without bleeding: Secondary | ICD-10-CM | POA: Insufficient documentation

## 2015-05-07 DIAGNOSIS — K5791 Diverticulosis of intestine, part unspecified, without perforation or abscess with bleeding: Secondary | ICD-10-CM

## 2015-05-07 DIAGNOSIS — K921 Melena: Secondary | ICD-10-CM | POA: Diagnosis not present

## 2015-05-07 DIAGNOSIS — Y9289 Other specified places as the place of occurrence of the external cause: Secondary | ICD-10-CM | POA: Insufficient documentation

## 2015-05-07 DIAGNOSIS — M810 Age-related osteoporosis without current pathological fracture: Secondary | ICD-10-CM | POA: Diagnosis not present

## 2015-05-07 DIAGNOSIS — G8929 Other chronic pain: Secondary | ICD-10-CM | POA: Diagnosis present

## 2015-05-07 DIAGNOSIS — T39395A Adverse effect of other nonsteroidal anti-inflammatory drugs [NSAID], initial encounter: Secondary | ICD-10-CM | POA: Insufficient documentation

## 2015-05-07 DIAGNOSIS — K219 Gastro-esophageal reflux disease without esophagitis: Secondary | ICD-10-CM | POA: Insufficient documentation

## 2015-05-07 DIAGNOSIS — M545 Low back pain: Secondary | ICD-10-CM | POA: Insufficient documentation

## 2015-05-07 DIAGNOSIS — E785 Hyperlipidemia, unspecified: Secondary | ICD-10-CM | POA: Diagnosis present

## 2015-05-07 DIAGNOSIS — Z7982 Long term (current) use of aspirin: Secondary | ICD-10-CM | POA: Diagnosis not present

## 2015-05-07 DIAGNOSIS — K922 Gastrointestinal hemorrhage, unspecified: Secondary | ICD-10-CM | POA: Diagnosis not present

## 2015-05-07 HISTORY — DX: Other amnesia: R41.3

## 2015-05-07 LAB — CBC
HEMATOCRIT: 42.9 % (ref 36.0–46.0)
HEMATOCRIT: 42.2 % (ref 36.0–46.0)
HEMOGLOBIN: 14.3 g/dL (ref 12.0–15.0)
HEMOGLOBIN: 14.1 g/dL (ref 12.0–15.0)
MCH: 31.3 pg (ref 26.0–34.0)
MCH: 31.5 pg (ref 26.0–34.0)
MCHC: 33.3 g/dL (ref 30.0–36.0)
MCHC: 33.4 g/dL (ref 30.0–36.0)
MCV: 93.9 fL (ref 78.0–100.0)
MCV: 94.2 fL (ref 78.0–100.0)
Platelets: 246 10*3/uL (ref 150–400)
Platelets: 219 10*3/uL (ref 150–400)
RBC: 4.57 MIL/uL (ref 3.87–5.11)
RBC: 4.48 MIL/uL (ref 3.87–5.11)
RDW: 13.5 % (ref 11.5–15.5)
RDW: 13.4 % (ref 11.5–15.5)
WBC: 6.4 10*3/uL (ref 4.0–10.5)
WBC: 6.5 10*3/uL (ref 4.0–10.5)

## 2015-05-07 LAB — COMPREHENSIVE METABOLIC PANEL
ALBUMIN: 3.7 g/dL (ref 3.5–5.0)
ALT: 13 U/L — ABNORMAL LOW (ref 14–54)
ANION GAP: 12 (ref 5–15)
AST: 18 U/L (ref 15–41)
Alkaline Phosphatase: 42 U/L (ref 38–126)
BUN: 15 mg/dL (ref 6–20)
CO2: 23 mmol/L (ref 22–32)
Calcium: 9.1 mg/dL (ref 8.9–10.3)
Chloride: 105 mmol/L (ref 101–111)
Creatinine, Ser: 0.73 mg/dL (ref 0.44–1.00)
GFR calc Af Amer: >60 mL/min (ref >60)
GFR calc non Af Amer: >60 mL/min (ref >60)
Glucose, Bld: 104 mg/dL — ABNORMAL HIGH (ref 65–99)
POTASSIUM: 4 mmol/L (ref 3.5–5.1)
SODIUM: 140 mmol/L (ref 135–145)
Total Bilirubin: 0.7 mg/dL (ref 0.3–1.2)
Total Protein: 6.5 g/dL (ref 6.5–8.1)

## 2015-05-07 LAB — DIFFERENTIAL
Basophils Absolute: 0.1 10*3/uL (ref 0.0–0.1)
Basophils Relative: 1 %
Eosinophils Absolute: 0.3 10*3/uL (ref 0.0–0.7)
Eosinophils Relative: 5 %
LYMPHS ABS: 2.3 10*3/uL (ref 0.7–4.0)
LYMPHS PCT: 36 %
MONO ABS: 0.5 10*3/uL (ref 0.1–1.0)
MONOS PCT: 7 %
NEUTROS ABS: 3.2 10*3/uL (ref 1.7–7.7)
Neutrophils Relative %: 51 %

## 2015-05-07 LAB — TYPE AND SCREEN
ABO/RH(D): O POS
ANTIBODY SCREEN: NEGATIVE

## 2015-05-07 LAB — PROTIME-INR
INR: 0.92 (ref 0.00–1.49)
PROTHROMBIN TIME: 12.5 s (ref 11.6–15.2)

## 2015-05-07 LAB — MRSA PCR SCREENING: MRSA BY PCR: NEGATIVE

## 2015-05-07 LAB — POC OCCULT BLOOD, ED: FECAL OCCULT BLD: POSITIVE — AB

## 2015-05-07 LAB — APTT: aPTT: 26 seconds (ref 24–37)

## 2015-05-07 LAB — ABO/RH: ABO/RH(D): O POS

## 2015-05-07 MED ORDER — SODIUM CHLORIDE 0.9 % IV SOLN
INTRAVENOUS | Status: DC
Start: 2015-05-07 — End: 2015-05-08
  Administered 2015-05-07: 50 mL/h via INTRAVENOUS
  Administered 2015-05-08: 01:00:00 via INTRAVENOUS

## 2015-05-07 MED ORDER — ONDANSETRON HCL 4 MG/2ML IJ SOLN: 4.0000 mg | Freq: Four times a day (QID) | INTRAMUSCULAR | Status: DC | PRN

## 2015-05-07 MED ORDER — PANTOPRAZOLE SODIUM 40 MG IV SOLR
40.0000 mg | INTRAVENOUS | Status: DC
  Administered 2015-05-07: 40 mg via INTRAVENOUS
  Filled 2015-05-07: qty 40

## 2015-05-07 MED ORDER — ACETAMINOPHEN 325 MG PO TABS: 650.0000 mg | ORAL_TABLET | Freq: Four times a day (QID) | ORAL | Status: DC | PRN

## 2015-05-07 MED ORDER — SODIUM CHLORIDE 0.9% FLUSH
3.0000 mL | Freq: Two times a day (BID) | INTRAVENOUS | Status: DC
  Administered 2015-05-07: 3 mL via INTRAVENOUS

## 2015-05-07 MED ORDER — ONDANSETRON HCL 4 MG PO TABS: 4.0000 mg | ORAL_TABLET | Freq: Four times a day (QID) | ORAL | Status: DC | PRN

## 2015-05-07 MED ORDER — ACETAMINOPHEN 650 MG RE SUPP: 650.0000 mg | Freq: Four times a day (QID) | RECTAL | Status: DC | PRN

## 2015-05-07 NOTE — ED Provider Notes (Signed)
CSN: [id number]     Arrival date & time 05/07/15  4098 History   First MD Initiated Contact with Patient 05/07/15 [phone]     Chief Complaint  Patient presents with  . Rectal Bleeding     (Consider location/radiation/quality/duration/timing/severity/associated sxs/prior Treatment) HPI  75 year old female who presents with rectal bleeding. States 6 months last colonoscopy with possible polyps. States that had 1-2 episodes of dark blood per rectum with bowel movements three days ago. Saw PCP and felt to be stable. Bleeding worsening over past 24 hours. Passing blood clots with bowel movements 4-5 times yesterday evening and 4-5 times this morning. Felt briefly lightheaded last night, but no syncope, chest pain, dyspnea, or DOE/fatigue. Takes baby aspirin and over past 2-3 weeks taking NSAID for hip pain as she ran out of  Home tramadol.   Past Medical History  Diagnosis Date  . Hyperlipidemia     takes Tricor daily  . Pneumonia 10+yrs ago    hx of  . Headache(784.0)     occasionally  . Joint pain   . Osteoporosis   . Chronic back pain     HNP  . GERD (gastroesophageal reflux disease)     occasionally but doesn't take any meds  . Urinary frequency   . History of shingles    Past Surgical History  Procedure Laterality Date  . Tubal ligation    . Colonoscopy    . Lumbar laminectomy Right 09/11/2013    Procedure: MICRODISCECTOMY LUMBAR LAMINECTOMY;  Surgeon: Eldred Manges, MD;  Location: [facility] OR;  Service: Orthopedics;  Laterality: Right;  Right L2-3 Microdiscectomy   History reviewed. No pertinent family history. Social History  Substance Use Topics  . Smoking status: Never Smoker   . Smokeless tobacco: None  . Alcohol Use: No   OB History    No data available     Review of Systems 10/14 systems reviewed and are negative other than those stated in the HPI    Allergies  Review of patient's allergies indicates no known allergies.  Home Medications   Prior to Admission  medications   Medication Sig Start Date End Date Taking? Authorizing Provider  aspirin EC 81 MG tablet Take 81 mg by mouth daily.    Historical Provider, MD  fenofibrate (TRICOR) 145 MG tablet Take 145 mg by mouth daily.    Historical Provider, MD  oxyCODONE-acetaminophen (ROXICET) 5-325 MG per tablet Take 1-2 tablets by mouth every 4 (four) hours as needed. 09/11/13   Maud Deed, PA-C  risedronate (ACTONEL) 150 MG tablet Take 150 mg by mouth every 30 (thirty) days. with water on empty stomach, nothing by mouth or lie down for next 30 minutes.    Historical Provider, MD   BP 154/87 mmHg  Pulse 75  Temp(Src) 98.2 F (36.8 C) (Oral)  Resp 18  Ht  (1.575 m)  Wt 162 lb (73.483 kg)  BMI 29.62 kg/m2  SpO2 95% Physical Exam Physical Exam  Nursing note and vitals reviewed. Constitutional: Well developed, well nourished, non-toxic, and in no acute distress Head: Normocephalic and atraumatic.  Mouth/Throat: Oropharynx is clear and moist.  Neck: Normal range of motion. Neck supple.  Cardiovascular: Normal rate and regular rhythm.   Pulmonary/Chest: Effort normal and breath sounds normal.  Abdominal: Soft. There is no tenderness. There is no rebound and no guarding. Hematochezia on rectal exam. Small non-thrombosed external hemorrhoid. Musculoskeletal: Normal range of motion.  Neurological: Alert, no facial droop, fluent speech, moves all extremities  symmetrically Skin: Skin is warm and dry.  Psychiatric: Cooperative   ED Course  Procedures (including critical care time) Labs Review Labs Reviewed  COMPREHENSIVE METABOLIC PANEL - Abnormal; Notable for the following:    Glucose, Bld 104 (*)    ALT 13 (*)    All other components within normal limits  POC OCCULT BLOOD, ED - Abnormal; Notable for the following:    Fecal Occult Bld POSITIVE (*)    All other components within normal limits  CBC  PROTIME-INR  APTT  DIFFERENTIAL  TYPE AND SCREEN  ABO/RH    Imaging Review No  results found. I have personally reviewed and evaluated these images and lab results as part of my medical decision-making.   EKG Interpretation None      MDM   Final diagnoses:  Lower GI bleed  Diverticulosis of intestine with bleeding, unspecified intestinal tract location    66 her old female who presents with lower GI bleeding. Hemodynamically stable on presentation, and has a normal hemoglobin. Has a hematochezia on rectal exam, and has a soft and benign abdomen. I spoke with Dr. Loreta Ave from gastroenterology who performed patient's colonoscopy 05/21/2014. Patient had extensive diverticulosis at that time, and she suspects LGIB. She will see patient in the hospital for evaluation. I discussed with hospitalist service who will admit given active GI bleed.    Lavera Guise, MD 05/07/15 [phone]

## 2015-05-07 NOTE — ED Notes (Signed)
Onset 3-4 days ago rectal bleeding seen primary Doctor 3 days ago. Stated since then had another bowel movement black with clots one day ago. Last BM was today at 0700 black with odor.

## 2015-05-07 NOTE — Consult Note (Signed)
Reason for Consult: Hematochezia Referring Physician: [facility]  [facility] ISA HITZ HPI: This is a 75 year old female with a PMH of chronic low back pain, hyperlipidemia, and diverticula who presents to the hospital with complaints of hematchezia.  The bleeding started 3 days ago and it was dark in color/maroon, however, over the past day the bleeding has converted over to bright red blood.  No complaints of abdominal pain, fever, nausea, or vomiting.  On 05/2014 she underwent a routine colonoscopy with findings of a pandiverticulosis.  Past Medical History  Diagnosis Date  . Hyperlipidemia     takes Tricor daily  . Pneumonia 10+yrs ago    hx of  . Headache(784.0)     occasionally  . Joint pain   . Osteoporosis   . Chronic back pain     HNP  . GERD (gastroesophageal reflux disease)     occasionally but doesn't take any meds  . Urinary frequency   . History of shingles   . Short-term memory loss     Past Surgical History  Procedure Laterality Date  . Tubal ligation    . Colonoscopy    . Lumbar laminectomy Right 09/11/2013    Procedure: MICRODISCECTOMY LUMBAR LAMINECTOMY;  Surgeon: Eldred Manges, MD;  Location: [facility] OR;  Service: Orthopedics;  Laterality: Right;  Right L2-3 Microdiscectomy    History reviewed. No pertinent family history.  Social History:  reports that she has never smoked. She does not have any smokeless tobacco history on file. She reports that she does not drink alcohol or use illicit drugs.  Allergies: No Known Allergies  Medications:  Scheduled:  Continuous: . sodium chloride 50 mL/hr (05/07/15 1206)    Results for orders placed or performed during the hospital encounter of 05/07/15 (from the past 24 hour(s))  Comprehensive metabolic panel     Status: Abnormal   Collection Time: 05/07/15  9:45 AM  Result Value Ref Range   Sodium 140 135 - 145 mmol/L   Potassium 4.0 3.5 - 5.1 mmol/L   Chloride 105 101 - 111 mmol/L   CO2 23 22 - 32 mmol/L   Glucose, Bld 104 (H) 65 - 99 mg/dL   BUN 15 6 - 20 mg/dL   Creatinine, Ser 9.56 0.44 - 1.00 mg/dL   Calcium 9.1 8.9 - 21.3 mg/dL   Total Protein 6.5 6.5 - 8.1 g/dL   Albumin 3.7 3.5 - 5.0 g/dL   AST 18 15 - 41 U/L   ALT 13 (L) 14 - 54 U/L   Alkaline Phosphatase 42 38 - 126 U/L   Total Bilirubin 0.7 0.3 - 1.2 mg/dL   GFR calc non Af Amer >60 >60 mL/min   GFR calc Af Amer >60 >60 mL/min   Anion gap 12 5 - 15  CBC     Status: None   Collection Time: 05/07/15  9:45 AM  Result Value Ref Range   WBC 6.4 4.0 - 10.5 K/uL   RBC 4.57 3.87 - 5.11 MIL/uL   Hemoglobin 14.3 12.0 - 15.0 g/dL   HCT 08.6 57.8 - 46.9 %   MCV 93.9 78.0 - 100.0 fL   MCH 31.3 26.0 - 34.0 pg   MCHC 33.3 30.0 - 36.0 g/dL   RDW 62.9 52.8 - 41.3 %   Platelets 246 150 - 400 K/uL  Type and screen [facility]     Status: None   Collection Time: 05/07/15  9:45 AM  Result Value Ref Range  ABO/RH(D) O POS    Antibody Screen NEG    Sample Expiration 05/10/2015   Differential     Status: None   Collection Time: 05/07/15  9:45 AM  Result Value Ref Range   Neutrophils Relative % 51 %   Neutro Abs 3.2 1.7 - 7.7 K/uL   Lymphocytes Relative 36 %   Lymphs Abs 2.3 0.7 - 4.0 K/uL   Monocytes Relative 7 %   Monocytes Absolute 0.5 0.1 - 1.0 K/uL   Eosinophils Relative 5 %   Eosinophils Absolute 0.3 0.0 - 0.7 K/uL   Basophils Relative 1 %   Basophils Absolute 0.1 0.0 - 0.1 K/uL  ABO/Rh     Status: None   Collection Time: 05/07/15  9:45 AM  Result Value Ref Range   ABO/RH(D) O POS   POC occult blood, ED     Status: Abnormal   Collection Time: 05/07/15 10:02 AM  Result Value Ref Range   Fecal Occult Bld POSITIVE (A) NEGATIVE  Protime-INR     Status: None   Collection Time: 05/07/15 10:38 AM  Result Value Ref Range   Prothrombin Time 12.5 11.6 - 15.2 seconds   INR 0.92 0.00 - 1.49  APTT     Status: None   Collection Time: 05/07/15 10:38 AM  Result Value Ref Range   aPTT 26 24 - 37 seconds     No  results found.  ROS:  As stated above in the HPI otherwise negative.  Blood pressure 154/87, pulse 75, temperature 98.2 F (36.8 C), temperature source Oral, resp. rate 18, height  (1.575 m), weight 73.483 kg (162 lb), SpO2 95 %.    PE: Gen: NAD, Alert and Oriented HEENT:  /AT, EOMI Neck: Supple, no LAD Lungs: CTA Bilaterally CV: RRR without M/G/R ABM: Soft, NTND, +BS Ext: No C/C/E  Assessment/Plan: 1) Probable diverticular bleed. 2) Hematochezia. 3) Hyperlipidemia.   The patient is hemodynamically stable.  Her HGB is in the 14 range.  No further bleeding currently.  Hopefully the bleeding will arrest soon and she can be discharged home.    Plan: 1) Follow HGB and transfuse as necessary.  Tarry Blayney D 05/07/2015, 1:30 PM

## 2015-05-07 NOTE — H&P (Signed)
[facility] History and Physical                                                                                    Stephanie Ingram, is a 75 y.o. female  MRN: [id number]   DOB - 31-Mar-1941  Admit Date - 05/07/2015  Outpatient Primary MD for the patient is Pamelia Hoit, MD  Referring MD: Verdie Mosher / ER  Consulting M.D: Loreta Ave / GI  PMH: Past Medical History  Diagnosis Date  . Hyperlipidemia     takes Tricor daily  . Pneumonia 10+yrs ago    hx of  . Headache(784.0)     occasionally  . Joint pain   . Osteoporosis   . Chronic back pain     HNP  . GERD (gastroesophageal reflux disease)     occasionally but doesn't take any meds  . Urinary frequency   . History of shingles   . Short-term memory loss       PSH: Past Surgical History  Procedure Laterality Date  . Tubal ligation    . Colonoscopy    . Lumbar laminectomy Right 09/11/2013    Procedure: MICRODISCECTOMY LUMBAR LAMINECTOMY;  Surgeon: Eldred Manges, MD;  Location: [facility] OR;  Service: Orthopedics;  Laterality: Right;  Right L2-3 Microdiscectomy     CC:  Chief Complaint  Patient presents with  . Rectal Bleeding     HPI: This is a 74 yo female patient with history of dyslipidemia, chronic low back pain on Ultram. Recently underwent colonoscopy about 6 months ago with findings of diverticulosis. Patient presents to the ER with reports of stuttering rectal bleeding for about 3 days primarily dark and maroon-colored stools. Over the past 24 hours she has noticed increasing clots and bleeding is more red in nature. She's had no abdominal pain, nausea or vomiting. Patient is on chronic Tramadol and about 2-3 weeks ago ran out so began taking ibuprofen. Interestingly when I questioned patient about this medication choice she states "I used to take Tylenol but it was rough on the kidney's so I stopped".  ER Evaluation and treatment: Afebrile, normotensive, room air saturations 97% EKG: Sinus rhythm with ventricular rate  81 bpm, QTC 426 ms, no acute ischemic changes. Laboratory data: K+ 4.0, BUN 15 and cr 0.73, glucose 104, hemoglobin 14.3 with baseline 13.2, PT 12.5, INR 0.92, PTT 26, FOB positive  Review of Systems   In addition to the HPI above,  No Fever-chills, myalgias or other constitutional symptoms No Headache, changes with Vision or hearing, new weakness, tingling, numbness in any extremity, No problems swallowing food or Liquids, indigestion/reflux No Chest pain, Cough or Shortness of Breath, palpitations, orthopnea or DOE No Abdominal pain, N/V No dysuria, hematuria or flank pain No new skin rashes, lesions, masses or bruises, No new joints pains-aches No recent weight gain or loss No polyuria, polydypsia or polyphagia,  *A full 10 point Review of Systems was done, except as stated above, all other Review of Systems were negative.  Social History Social History  Substance Use Topics  . Smoking status: Never Smoker   . Smokeless tobacco: Not on file  . Alcohol Use: No  Resides at: Private residence  Lives with: Alone  Ambulatory status: At times will utilize a cane or rolling walker and feels weak   Family History History reviewed. No pertinent family history reported by patient such as significant CAD, colon cancer, thyroid disease, known GI bleeding or anemias   Prior to Admission medications   Medication Sig Start Date End Date Taking? Authorizing Provider  aspirin EC 81 MG tablet Take 81 mg by mouth daily.    Historical Provider, MD  fenofibrate (TRICOR) 145 MG tablet Take 145 mg by mouth daily.    Historical Provider, MD  oxyCODONE-acetaminophen (ROXICET) 5-325 MG per tablet Take 1-2 tablets by mouth every 4 (four) hours as needed. 09/11/13   Maud Deed, PA-C  risedronate (ACTONEL) 150 MG tablet Take 150 mg by mouth every 30 (thirty) days. with water on empty stomach, nothing by mouth or lie down for next 30 minutes.    Historical Provider, MD    No Known  Allergies  Physical Exam  Vitals  Blood pressure 154/87, pulse 75, temperature 98.2 F (36.8 C), temperature source Oral, resp. rate 18, height  (1.575 m), weight 162 lb (73.483 kg), SpO2 95 %.   General:  In no acute distress, appears healthy and well nourished  Psych:  Normal affect, Denies Suicidal or Homicidal ideations, Awake Alert, Oriented X 3. Speech and thought patterns are clear and appropriate  Neuro:   No focal neurological deficits, CN II through XII intact, Strength 5/5 all 4 extremities, Sensation intact all 4 extremities.  ENT:  Ears and Eyes appear Normal, Conjunctivae clear, PER. Moist oral mucosa without erythema or exudates.  Neck:  Supple, No lymphadenopathy appreciated  Respiratory:  Symmetrical chest wall movement, Good air movement bilaterally, CTAB. Room Air  Cardiac:  RRR, No Murmurs, no LE edema noted, no JVD, No carotid bruits, peripheral pulses palpable at 2+  Abdomen:  Positive bowel sounds, Soft, Non tender, Non distended,  No masses appreciated, no obvious hepatosplenomegaly  Skin:  No Cyanosis, Normal Skin Turgor, No Skin Rash or Bruise.  Extremities: Symmetrical without obvious trauma or injury,  no effusions.  Data Review  CBC  Recent Labs Lab 05/07/15 0945  WBC 6.4  HGB 14.3  HCT 42.9  PLT 246  MCV 93.9  MCH 31.3  MCHC 33.3  RDW 13.5  LYMPHSABS 2.3  MONOABS 0.5  EOSABS 0.3  BASOSABS 0.1    Chemistries   Recent Labs Lab 05/07/15 0945  NA 140  K 4.0  CL 105  CO2 23  GLUCOSE 104*  BUN 15  CREATININE 0.73  CALCIUM 9.1  AST 18  ALT 13*  ALKPHOS 42  BILITOT 0.7    estimated creatinine clearance is 56.2 mL/min (by C-G formula based on Cr of 0.73).  No results for input(s): TSH, T4TOTAL, T3FREE, THYROIDAB in the last 72 hours.  Invalid input(s): FREET3  Coagulation profile  Recent Labs Lab 05/07/15 1038  INR 0.92    No results for input(s): DDIMER in the last 72 hours.  Cardiac Enzymes No results  for input(s): CKMB, TROPONINI, MYOGLOBIN in the last 168 hours.  Invalid input(s): CK  Invalid input(s): POCBNP  Urinalysis    Component Value Date/Time   COLORURINE YELLOW 09/09/2013 1444   APPEARANCEUR CLOUDY* 09/09/2013 1444   LABSPEC 1.018 09/09/2013 1444   PHURINE 5.5 09/09/2013 1444   GLUCOSEU NEGATIVE 09/09/2013 1444   HGBUR MODERATE* 09/09/2013 1444   BILIRUBINUR NEGATIVE 09/09/2013 1444   KETONESUR NEGATIVE 09/09/2013 1444  PROTEINUR NEGATIVE 09/09/2013 1444   UROBILINOGEN 0.2 09/09/2013 1444   NITRITE POSITIVE* 09/09/2013 1444   LEUKOCYTESUR LARGE* 09/09/2013 1444    Imaging results:   No results found.   EKG: (Independently reviewed)  Sinus rhythm with ventricular rate 81 bpm, QTC 426 ms, no acute ischemic changes.   Assessment & Plan  Principal Problem:   Lower GI bleed/Hematochezia -Per EDP on digital rectal exam stool clearly melanotic; she has discussed with GI/Mann who suspects lower GI source so did not start PPI-GI see after patient formally admitted -Painless bleeding so suspect diverticular in etiology -Admit to SDU/Obs -Currently not anemic but we'll cycle CBC every 12 hours with next reading due at 1500 -Clear liquids only -Normal saline IV fluid 50/hr -As precaution hold all NSAIDs including preadmission baby aspirin  Active Problems:   Chronic lower back pain -Patient reporting transitioned to NSAIDs past 2 weeks since ran out of Tramadol -Tylenol for now    HLD  -Holding preadmission medications while on clear liquids only    DVT Prophylaxis: SCDs  Family Communication:   No family at bedside  Code Status:  Full code  Condition:  Stable  Discharge disposition: Anticipate discharge back to preadmission and environment pending identification and resolution of GI bleeding symptomatology  Time spent in minutes : 60      ELLIS,ALLISON L. ANP on 05/07/2015 at 12:05 PM  You may contact me by going to [email or web address] - password  [email or web address]  I am available from 7a-7p but please confirm I am on the schedule by going to Amion as above.   After 7p please contact night coverage person covering me after hours  [facility]

## 2015-05-07 NOTE — ED Notes (Signed)
EKG completed given to EDP.  

## 2015-05-07 NOTE — ED Notes (Signed)
Called floor for report nurse assisting another patient left phone number to call back.

## 2015-05-07 NOTE — ED Notes (Signed)
Doctor at bedside.

## 2015-05-07 NOTE — ED Notes (Signed)
Admit provider at bedside 

## 2015-05-08 ENCOUNTER — Encounter (HOSPITAL_COMMUNITY): Payer: Self-pay

## 2015-05-08 DIAGNOSIS — M545 Low back pain: Secondary | ICD-10-CM

## 2015-05-08 DIAGNOSIS — K921 Melena: Principal | ICD-10-CM

## 2015-05-08 DIAGNOSIS — K922 Gastrointestinal hemorrhage, unspecified: Secondary | ICD-10-CM

## 2015-05-08 DIAGNOSIS — E785 Hyperlipidemia, unspecified: Secondary | ICD-10-CM | POA: Diagnosis not present

## 2015-05-08 DIAGNOSIS — G8929 Other chronic pain: Secondary | ICD-10-CM

## 2015-05-08 LAB — CBC
HEMATOCRIT: 39.1 % (ref 36.0–46.0)
HEMOGLOBIN: 13.1 g/dL (ref 12.0–15.0)
MCH: 31.5 pg (ref 26.0–34.0)
MCHC: 33.5 g/dL (ref 30.0–36.0)
MCV: 94 fL (ref 78.0–100.0)
Platelets: 220 10*3/uL (ref 150–400)
RBC: 4.16 MIL/uL (ref 3.87–5.11)
RDW: 13.5 % (ref 11.5–15.5)
WBC: 5.7 10*3/uL (ref 4.0–10.5)

## 2015-05-08 LAB — BASIC METABOLIC PANEL
Anion gap: 9 (ref 5–15)
BUN: 10 mg/dL (ref 6–20)
CHLORIDE: 105 mmol/L (ref 101–111)
CO2: 27 mmol/L (ref 22–32)
Calcium: 8.9 mg/dL (ref 8.9–10.3)
Creatinine, Ser: 0.65 mg/dL (ref 0.44–1.00)
GFR calc Af Amer: >60 mL/min (ref >60)
GFR calc non Af Amer: >60 mL/min (ref >60)
Glucose, Bld: 95 mg/dL (ref 65–99)
POTASSIUM: 3.9 mmol/L (ref 3.5–5.1)
SODIUM: 141 mmol/L (ref 135–145)

## 2015-05-08 MED ORDER — PANTOPRAZOLE SODIUM 40 MG PO TBEC
40.0000 mg | DELAYED_RELEASE_TABLET | Freq: Every day | ORAL | Status: DC
  Administered 2015-05-08: 40 mg via ORAL
  Filled 2015-05-08: qty 1

## 2015-05-08 MED ORDER — PANTOPRAZOLE SODIUM 40 MG PO TBEC: 40.0000 mg | DELAYED_RELEASE_TABLET | Freq: Every day | ORAL | Status: DC

## 2015-05-08 MED ORDER — TRAMADOL HCL 50 MG PO TABS: 50.0000 mg | ORAL_TABLET | Freq: Four times a day (QID) | ORAL | Status: DC | PRN

## 2015-05-08 MED ORDER — TRAMADOL HCL 50 MG PO TABS: 50.0000 mg | ORAL_TABLET | Freq: Three times a day (TID) | ORAL | Status: DC | PRN

## 2015-05-08 MED ORDER — FENOFIBRATE 160 MG PO TABS
160.0000 mg | ORAL_TABLET | Freq: Every day | ORAL | Status: DC
  Filled 2015-05-08: qty 1

## 2015-05-08 MED ORDER — METHOCARBAMOL 500 MG PO TABS: 500.0000 mg | ORAL_TABLET | Freq: Three times a day (TID) | ORAL | Status: DC | PRN

## 2015-05-08 NOTE — Progress Notes (Signed)
Subjective: No further bleeding since 7 AM yesterday morning.    Objective: Vital signs in last 24 hours: Temp:  [97.7 F (36.5 C)-98.3 F (36.8 C)] 98.3 F (36.8 C) (02/03 0402) Pulse Rate:  [72-90] 74 (02/03 0402) Resp:  [13-24] 16 (02/03 0402) BP: (105-159)/(78-116) 138/86 mmHg (02/03 0402) SpO2:  [93 %-99 %] 94 % (02/03 0402) Weight:  [72.4 kg (159 lb 9.8 oz)-73.483 kg (162 lb)] 72.4 kg (159 lb 9.8 oz) (02/02 1517) Last BM Date: 05/07/15  Intake/Output from previous day: 02/02 0701 - 02/03 0700 In: 1040 [P.O.:240; I.V.:800] Out: 1550 [Urine:1550] Intake/Output this shift:    General appearance: alert and no distress GI: soft, non-tender; bowel sounds normal; no masses,  no organomegaly  Lab Results:  Recent Labs  05/07/15 0945 05/07/15 1546 05/08/15 0340  WBC 6.4 6.5 5.7  HGB 14.3 14.1 13.1  HCT 42.9 42.2 39.1  PLT 246 219 220   BMET  Recent Labs  05/07/15 0945 05/08/15 0340  NA 140 141  K 4.0 3.9  CL 105 105  CO2 23 27  GLUCOSE 104* 95  BUN 15 10  CREATININE 0.73 0.65  CALCIUM 9.1 8.9   LFT  Recent Labs  05/07/15 0945  PROT 6.5  ALBUMIN 3.7  AST 18  ALT 13*  ALKPHOS 42  BILITOT 0.7   PT/INR  Recent Labs  05/07/15 1038  LABPROT 12.5  INR 0.92   Hepatitis Panel No results for input(s): HEPBSAG, HCVAB, HEPAIGM, HEPBIGM in the last 72 hours. C-Diff No results for input(s): CDIFFTOX in the last 72 hours. Fecal Lactopherrin No results for input(s): FECLLACTOFRN in the last 72 hours.  Studies/Results: No results found.  Medications:  Scheduled: . fenofibrate  160 mg Oral Daily  . pantoprazole  40 mg Oral Q0600  . sodium chloride flush  3 mL Intravenous Q12H   Continuous:   Assessment/Plan: 1) Diverticular bleed.   Her HGB is relatively stable.  No further bleeding.  She is stable for discharge.  Again, I did reiterate that this issue is notorious for a recurrence.  Plan: 1) Okay to D/C home. 2) Follow up with Dr. Loreta Ave in  2 weeks. 3) Signing off.   LOS: 1 day   Serenidy Waltz D 05/08/2015, 8:16 AM

## 2015-05-08 NOTE — Progress Notes (Signed)
Discharge Note:  Patient alert and oriented X 4 and in no apparent distress.  Patient and her daughter given instructions regarding medications, s/s to report, activity, diet, and upcoming appointments.  Patient verbalized understanding of all instructions. Telemetry and peripheral IV discontinued.  Patient confirmed that she had all of her personal belongings and was transported out via wheelchair by hospital volunteer.

## 2015-05-08 NOTE — Discharge Summary (Signed)
Physician Discharge Summary   Patient ID: GIOVANNI BATH MRN: [id number] DOB/AGE: Oct 03, 1940 75 y.o.  Admit date: 05/07/2015 Discharge date: 05/08/2015  Primary Care Physician:  Pamelia Hoit, MD  Discharge Diagnoses:    . Lower GI bleed likely diverticular  . Hematochezia . Chronic lower back pain . HLD (hyperlipidemia)  Consults: Gastroenterology, Dr. Elnoria Howard  Recommendations for Outpatient Follow-up:  1. Please repeat CBC/BMET at next visit 2. Patient was recommended to stop NSAIDs, hold aspirin for now   DIET: Heart healthy diet    Allergies:  No Known Allergies   DISCHARGE MEDICATIONS: Current Discharge Medication List    START taking these medications   Details  methocarbamol (ROBAXIN) 500 MG tablet Take 1 tablet (500 mg total) by mouth every 8 (eight) hours as needed for muscle spasms. Qty: 30 tablet, Refills: 1    pantoprazole (PROTONIX) 40 MG tablet Take 1 tablet (40 mg total) by mouth daily at 6 (six) AM. Qty: 30 tablet, Refills: 3    traMADol (ULTRAM) 50 MG tablet Take 1 tablet (50 mg total) by mouth every 8 (eight) hours as needed for moderate pain or severe pain. Qty: 60 tablet, Refills: 0      CONTINUE these medications which have NOT CHANGED   Details  alendronate (FOSAMAX) 70 MG tablet Take 70 mg by mouth once a week. Take with a full glass of water on an empty stomach.    calcium carbonate (TUMS - DOSED IN MG ELEMENTAL CALCIUM) 500 MG chewable tablet Chew 1 tablet by mouth daily.    Cranberry 1000 MG CAPS Take 1 capsule by mouth daily.    fenofibrate (TRICOR) 145 MG tablet Take 145 mg by mouth daily.    vitamin B-12 (CYANOCOBALAMIN) 1000 MCG tablet Take 1,000 mcg by mouth daily.      STOP taking these medications     aspirin EC 81 MG tablet      ibuprofen (ADVIL,MOTRIN) 200 MG tablet          Brief H and P: For complete details please refer to admission H and P, but in brief This is a 75 yo female patient with history of  dyslipidemia, chronic low back pain on Ultram. Recently underwent colonoscopy about 6 months ago with findings of diverticulosis. Patient presented to the ER with reports of stuttering rectal bleeding for about 3 days primarily dark and maroon-colored stools. Over the past 24 hours she had noticed increasing clots and bleeding is more red in nature. She's had no abdominal pain, nausea or vomiting. Patient is on chronic Tramadol and about 2-3 weeks ago ran out so began taking ibuprofen.   Hospital Course:  Lower GI bleed/Hematochezia: Likely diverticular, exacerbated due to NSAID use -Per EDP on digital rectal exam stool clearly melanotic. Patient was admitted to stepdown unit.  GI was consulted. Patient had serial CBC done and remained stable, she had no bleeding for over 24 hours. Hemoglobin 13.1 at the time of discharge. Patient was initially placed on clear liquid diet, advanced to solids. The patient was advised to stop NSAIDs and for now hold aspirin as well. She was given the prescription of tramadol. She was cleared by gastroenterology to be discharged home    Chronic lower back pain -Patient reporting transitioned to NSAIDs past 2 weeks since ran out of Tramadol. She was given the prescription for tramadol. Patient was recommended to follow-up with her PCP.    HLD  Continue Tri[facility]   Day of Discharge BP 115/85 mmHg  Pulse  101  Temp(Src) 97.8 F (36.6 C) (Oral)  Resp 13  Ht  (1.575 m)  Wt 72.4 kg (159 lb 9.8 oz)  BMI 29.19 kg/m2  SpO2 98%  Physical Exam: General: Alert and awake oriented x3 not in any acute distress. HEENT: anicteric sclera, pupils reactive to light and accommodation CVS: S1-S2 clear no murmur rubs or gallops Chest: clear to auscultation bilaterally, no wheezing rales or rhonchi Abdomen: soft nontender, nondistended, normal bowel sounds Extremities: no cyanosis, clubbing or edema noted bilaterally Neuro: Cranial nerves II-XII intact, no focal  neurological deficits   The results of significant diagnostics from this hospitalization (including imaging, microbiology, ancillary and laboratory) are listed below for reference.    LAB RESULTS: Basic Metabolic Panel:  Recent Labs Lab 05/07/15 0945 05/08/15 0340  NA 140 141  K 4.0 3.9  CL 105 105  CO2 23 27  GLUCOSE 104* 95  BUN 15 10  CREATININE 0.73 0.65  CALCIUM 9.1 8.9   Liver Function Tests:  Recent Labs Lab 05/07/15 0945  AST 18  ALT 13*  ALKPHOS 42  BILITOT 0.7  PROT 6.5  ALBUMIN 3.7   No results for input(s): LIPASE, AMYLASE in the last 168 hours. No results for input(s): AMMONIA in the last 168 hours. CBC:  Recent Labs Lab 05/07/15 0945 05/07/15 1546 05/08/15 0340  WBC 6.4 6.5 5.7  NEUTROABS 3.2  --   --   HGB 14.3 14.1 13.1  HCT 42.9 42.2 39.1  MCV 93.9 94.2 94.0  PLT 246 219 220   Cardiac Enzymes: No results for input(s): CKTOTAL, CKMB, CKMBINDEX, TROPONINI in the last 168 hours. BNP: Invalid input(s): POCBNP CBG: No results for input(s): GLUCAP in the last 168 hours.  Significant Diagnostic Studies:  No results found.  2D ECHO:   Disposition and Follow-up:    DISPOSITION: home .   DISCHARGE FOLLOW-UP Follow-up Information    Follow up with Pamelia Hoit, MD. Schedule an appointment as soon as possible for a visit in 2 weeks.   Specialty:  Family Medicine   Why:  for hospital follow-up, obtain labs CBC   Contact information:   [address] [place] [address] [phone]       Follow up with MANN,JYOTHI, MD. Schedule an appointment as soon as possible for a visit in 2 weeks.   Specialty:  Gastroenterology   Why:  for hospital follow-up   Contact information:   [address] [place] [address] [phone]        Time spent on Discharge: 25 minutes  Signed:   Ajamu Maxon M.D. [facility] 05/08/2015, 11:33 AM Pager: [phone]

## 2015-06-22 ENCOUNTER — Other Ambulatory Visit: Payer: Self-pay | Admitting: Orthopaedic Surgery

## 2015-06-24 ENCOUNTER — Other Ambulatory Visit (HOSPITAL_COMMUNITY): Payer: Self-pay | Admitting: *Deleted

## 2015-06-24 ENCOUNTER — Ambulatory Visit (HOSPITAL_COMMUNITY)
Admission: RE | Admit: 2015-06-24 | Discharge: 2015-06-24 | Disposition: A | Discharge status: Home / Self Care | Payer: Medicare HMO | Source: Ambulatory Visit | Attending: Orthopaedic Surgery | Admitting: Orthopaedic Surgery

## 2015-06-24 ENCOUNTER — Encounter (HOSPITAL_COMMUNITY)
Admission: RE | Admit: 2015-06-24 | Discharge: 2015-06-24 | Disposition: A | Discharge status: Home / Self Care | Payer: Medicare HMO | Source: Ambulatory Visit | Attending: Orthopaedic Surgery | Admitting: Orthopaedic Surgery

## 2015-06-24 ENCOUNTER — Encounter (HOSPITAL_COMMUNITY): Payer: Self-pay

## 2015-06-24 DIAGNOSIS — Z79899 Other long term (current) drug therapy: Secondary | ICD-10-CM | POA: Diagnosis not present

## 2015-06-24 DIAGNOSIS — Z7982 Long term (current) use of aspirin: Secondary | ICD-10-CM | POA: Insufficient documentation

## 2015-06-24 DIAGNOSIS — M5126 Other intervertebral disc displacement, lumbar region: Secondary | ICD-10-CM

## 2015-06-24 DIAGNOSIS — E785 Hyperlipidemia, unspecified: Secondary | ICD-10-CM | POA: Diagnosis not present

## 2015-06-24 DIAGNOSIS — Z01812 Encounter for preprocedural laboratory examination: Secondary | ICD-10-CM | POA: Insufficient documentation

## 2015-06-24 DIAGNOSIS — Z01818 Encounter for other preprocedural examination: Secondary | ICD-10-CM | POA: Diagnosis not present

## 2015-06-24 DIAGNOSIS — K219 Gastro-esophageal reflux disease without esophagitis: Secondary | ICD-10-CM | POA: Diagnosis not present

## 2015-06-24 HISTORY — DX: Unspecified osteoarthritis, unspecified site: M19.90

## 2015-06-24 HISTORY — DX: Gastrointestinal hemorrhage, unspecified: K92.2

## 2015-06-24 HISTORY — DX: Reserved for inherently not codable concepts without codable children: IMO0001

## 2015-06-24 LAB — PROTIME-INR
INR: 0.92 (ref 0.00–1.49)
Prothrombin Time: 12.5 seconds (ref 11.6–15.2)

## 2015-06-24 LAB — CBC
HCT: 44.8 % (ref 36.0–46.0)
Hemoglobin: 14.6 g/dL (ref 12.0–15.0)
MCH: 30.3 pg (ref 26.0–34.0)
MCHC: 32.6 g/dL (ref 30.0–36.0)
MCV: 92.9 fL (ref 78.0–100.0)
PLATELETS: 294 10*3/uL (ref 150–400)
RBC: 4.82 MIL/uL (ref 3.87–5.11)
RDW: 13.6 % (ref 11.5–15.5)
WBC: 8.1 10*3/uL (ref 4.0–10.5)

## 2015-06-24 LAB — COMPREHENSIVE METABOLIC PANEL
ALT: 21 U/L (ref 14–54)
AST: 25 U/L (ref 15–41)
Albumin: 3.8 g/dL (ref 3.5–5.0)
Alkaline Phosphatase: 45 U/L (ref 38–126)
Anion gap: 12 (ref 5–15)
BILIRUBIN TOTAL: 0.3 mg/dL (ref 0.3–1.2)
BUN: 14 mg/dL (ref 6–20)
CALCIUM: 9.6 mg/dL (ref 8.9–10.3)
CHLORIDE: 103 mmol/L (ref 101–111)
CO2: 24 mmol/L (ref 22–32)
Creatinine, Ser: 0.86 mg/dL (ref 0.44–1.00)
Glucose, Bld: 101 mg/dL — ABNORMAL HIGH (ref 65–99)
POTASSIUM: 4.2 mmol/L (ref 3.5–5.1)
Sodium: 139 mmol/L (ref 135–145)
TOTAL PROTEIN: 7.1 g/dL (ref 6.5–8.1)

## 2015-06-24 LAB — SURGICAL PCR SCREEN
MRSA, PCR: NEGATIVE
STAPHYLOCOCCUS AUREUS: NEGATIVE

## 2015-06-24 NOTE — Pre-Procedure Instructions (Signed)
    Stephanie Ingram  06/24/2015      [facility] [address]- [place], [place] - [address] [place] [id number] Phone: [phone] Fax: [phone]    Your procedure is scheduled on 06-29-2015   Monday   [facility] 12:50 PM.   Call this number if you have problems the morning of surgery:  [phone]   Remember:  Do not eat food or drink liquids after midnight.   Take these medicines the morning of surgery with A SIP OF WATER fenofibrate(Tricor),methocarbonal(Robaxin) if needed,Protonix,Tramadol(Ultra) if needed          Stop all over the counter supplements and vitamins 5 days prior to surgery     Do not wear jewelry, make-up or nail polish.  Do not wear lotions, powders, or perfumes.  You may not wear deodorant.  Do not shave 48 hours prior to surgery.   .  Do not bring valuables to the hospital.  [facility] is not responsible for any belongings or valuables.  Contacts, dentures or bridgework may not be worn into surgery.  Leave your suitcase in the car.  After surgery it may be brought to your room.  For patients admitted to the hospital, discharge time will be determined by your treatment team.  Patients discharged the day of surgery will not be allowed to drive home.    Special instructions:  See attached Sheet for instructions on CHG showers  Please read over the following fact sheets that you were given. Pain Booklet and Surgical Site Infection Prevention

## 2015-06-25 NOTE — Progress Notes (Signed)
Anesthesia Chart Review:  Pt is a 75 year old female scheduled for L3-4 microdiscectomy on 06/29/2015 with Dr. Ophelia CharterYates.   PCP is Dr. Benedetto GoadFred Wilson.   PMH includes:  Hyperlipidemia, GERD. Never smoker. BMI 28.5 S/p microdiscectomy lumbar laminectomy 09/11/13.   Medications include: ASA, protonix.   Preoperative labs reviewed.    Chest x-ray 06/24/2015 reviewed. Chronic lung changes with no evidence of superimposed acute cardiopulmonary disease.  EKG 05/07/15: Sinus rhythm. Anteroseptal infarct, age indeterminate. TWI in V2, otherwise, not significant change from prior EKG 09/09/13.   If no changes, I anticipate pt can proceed with surgery as scheduled.   Rica Mastngela Roshan Salamon, FNP-BC [facility]/Anesthesiology Phone: [phone] 06/25/2015 12:16 PM

## 2015-06-28 MED ORDER — DEXTROSE 5 % IV SOLN
2.0000 g | INTRAVENOUS | Status: AC
  Administered 2015-06-29: 2 g via INTRAVENOUS
  Filled 2015-06-28: qty 20

## 2015-06-29 ENCOUNTER — Encounter (HOSPITAL_COMMUNITY): Payer: Self-pay | Admitting: Surgery

## 2015-06-29 ENCOUNTER — Encounter (HOSPITAL_COMMUNITY)
Admission: RE | Disposition: A | Discharge status: Home / Self Care | Payer: Self-pay | Source: Ambulatory Visit | Attending: Orthopaedic Surgery

## 2015-06-29 ENCOUNTER — Ambulatory Visit (HOSPITAL_COMMUNITY): Payer: Medicare HMO | Admitting: Anesthesiology

## 2015-06-29 ENCOUNTER — Observation Stay (HOSPITAL_COMMUNITY)
Admission: RE | Admit: 2015-06-29 | Discharge: 2015-06-30 | Disposition: A | Discharge status: Home / Self Care | Payer: Medicare HMO | Source: Ambulatory Visit | Attending: Orthopaedic Surgery | Admitting: Orthopaedic Surgery

## 2015-06-29 ENCOUNTER — Ambulatory Visit (HOSPITAL_COMMUNITY): Payer: Medicare HMO

## 2015-06-29 ENCOUNTER — Ambulatory Visit (HOSPITAL_COMMUNITY): Payer: Medicare HMO | Admitting: Emergency Medicine

## 2015-06-29 DIAGNOSIS — K219 Gastro-esophageal reflux disease without esophagitis: Secondary | ICD-10-CM | POA: Insufficient documentation

## 2015-06-29 DIAGNOSIS — Z419 Encounter for procedure for purposes other than remedying health state, unspecified: Secondary | ICD-10-CM

## 2015-06-29 DIAGNOSIS — E785 Hyperlipidemia, unspecified: Secondary | ICD-10-CM | POA: Insufficient documentation

## 2015-06-29 DIAGNOSIS — Y838 Other surgical procedures as the cause of abnormal reaction of the patient, or of later complication, without mention of misadventure at the time of the procedure: Secondary | ICD-10-CM | POA: Insufficient documentation

## 2015-06-29 DIAGNOSIS — M5126 Other intervertebral disc displacement, lumbar region: Secondary | ICD-10-CM | POA: Insufficient documentation

## 2015-06-29 DIAGNOSIS — M960 Pseudarthrosis after fusion or arthrodesis: Secondary | ICD-10-CM | POA: Diagnosis not present

## 2015-06-29 DIAGNOSIS — Z9889 Other specified postprocedural states: Secondary | ICD-10-CM

## 2015-06-29 HISTORY — PX: LUMBAR LAMINECTOMY/DECOMPRESSION MICRODISCECTOMY: SHX5026

## 2015-06-29 SURGERY — LUMBAR LAMINECTOMY/DECOMPRESSION MICRODISCECTOMY
Anesthesia: General | Site: Spine Lumbar

## 2015-06-29 MED ORDER — TRAMADOL HCL 50 MG PO TABS
50.0000 mg | ORAL_TABLET | Freq: Once | ORAL | Status: DC | PRN
  Filled 2015-06-29: qty 1

## 2015-06-29 MED ORDER — DEXTROSE 5 % IV SOLN
500.0000 mg | Freq: Four times a day (QID) | INTRAVENOUS | Status: DC | PRN
  Administered 2015-06-29: 500 mg via INTRAVENOUS
  Filled 2015-06-29 (×2): qty 5

## 2015-06-29 MED ORDER — FENTANYL CITRATE (PF) 100 MCG/2ML IJ SOLN
INTRAMUSCULAR | Status: DC | PRN
  Administered 2015-06-29: 50 ug via INTRAVENOUS

## 2015-06-29 MED ORDER — BUPIVACAINE HCL (PF) 0.25 % IJ SOLN
INTRAMUSCULAR | Status: AC
  Filled 2015-06-29: qty 30

## 2015-06-29 MED ORDER — SODIUM CHLORIDE 0.9% FLUSH: 3.0000 mL | INTRAVENOUS | Status: DC | PRN

## 2015-06-29 MED ORDER — NEOSTIGMINE METHYLSULFATE 10 MG/10ML IV SOLN
INTRAVENOUS | Status: DC | PRN
  Administered 2015-06-29: 4 mg via INTRAVENOUS

## 2015-06-29 MED ORDER — PHENYLEPHRINE 40 MCG/ML (10ML) SYRINGE FOR IV PUSH (FOR BLOOD PRESSURE SUPPORT)
PREFILLED_SYRINGE | INTRAVENOUS | Status: AC
  Filled 2015-06-29: qty 10

## 2015-06-29 MED ORDER — MENTHOL 3 MG MT LOZG: 1.0000 | LOZENGE | OROMUCOSAL | Status: DC | PRN

## 2015-06-29 MED ORDER — LIDOCAINE HCL (CARDIAC) 20 MG/ML IV SOLN
INTRAVENOUS | Status: AC
  Filled 2015-06-29: qty 5

## 2015-06-29 MED ORDER — FENTANYL CITRATE (PF) 250 MCG/5ML IJ SOLN
INTRAMUSCULAR | Status: AC
  Filled 2015-06-29: qty 5

## 2015-06-29 MED ORDER — PROMETHAZINE HCL 25 MG/ML IJ SOLN: 6.2500 mg | INTRAMUSCULAR | Status: DC | PRN

## 2015-06-29 MED ORDER — CEFAZOLIN SODIUM-DEXTROSE 2-4 GM/100ML-% IV SOLN
INTRAVENOUS | Status: AC
Start: 2015-06-29 — End: 2015-06-30
  Filled 2015-06-29: qty 100

## 2015-06-29 MED ORDER — ONDANSETRON HCL 4 MG/2ML IJ SOLN
INTRAMUSCULAR | Status: DC | PRN
  Administered 2015-06-29: 4 mg via INTRAVENOUS

## 2015-06-29 MED ORDER — BUPIVACAINE HCL (PF) 0.25 % IJ SOLN
INTRAMUSCULAR | Status: DC | PRN
  Administered 2015-06-29: 10 mL

## 2015-06-29 MED ORDER — ROCURONIUM BROMIDE 100 MG/10ML IV SOLN
INTRAVENOUS | Status: DC | PRN
  Administered 2015-06-29: 40 mg via INTRAVENOUS

## 2015-06-29 MED ORDER — PHENYLEPHRINE HCL 10 MG/ML IJ SOLN
INTRAMUSCULAR | Status: DC | PRN
  Administered 2015-06-29 (×2): 40 ug via INTRAVENOUS
  Administered 2015-06-29: 80 ug via INTRAVENOUS
  Administered 2015-06-29: 40 ug via INTRAVENOUS
  Administered 2015-06-29 (×3): 80 ug via INTRAVENOUS
  Administered 2015-06-29: 40 ug via INTRAVENOUS
  Administered 2015-06-29: 80 ug via INTRAVENOUS

## 2015-06-29 MED ORDER — OXYCODONE-ACETAMINOPHEN 5-325 MG PO TABS
1.0000 | ORAL_TABLET | Freq: Four times a day (QID) | ORAL | Status: DC | PRN
  Administered 2015-06-29 – 2015-06-30 (×3): 1 via ORAL
  Filled 2015-06-29 (×3): qty 1

## 2015-06-29 MED ORDER — CHLORHEXIDINE GLUCONATE 4 % EX LIQD: 60.0000 mL | Freq: Once | CUTANEOUS | Status: DC

## 2015-06-29 MED ORDER — HYDROMORPHONE HCL 1 MG/ML IJ SOLN
0.2500 mg | INTRAMUSCULAR | Status: DC | PRN
  Administered 2015-06-29 (×2): 0.5 mg via INTRAVENOUS

## 2015-06-29 MED ORDER — DOCUSATE SODIUM 100 MG PO CAPS
100.0000 mg | ORAL_CAPSULE | Freq: Two times a day (BID) | ORAL | Status: DC
  Administered 2015-06-29 – 2015-06-30 (×2): 100 mg via ORAL
  Filled 2015-06-29 (×2): qty 1

## 2015-06-29 MED ORDER — SODIUM CHLORIDE 0.9 % IV SOLN: 250.0000 mL | INTRAVENOUS | Status: DC

## 2015-06-29 MED ORDER — HYDROMORPHONE HCL 1 MG/ML IJ SOLN
INTRAMUSCULAR | Status: AC
  Administered 2015-06-29: 0.5 mg via INTRAVENOUS
  Filled 2015-06-29: qty 1

## 2015-06-29 MED ORDER — PHENOL 1.4 % MT LIQD: 1.0000 | OROMUCOSAL | Status: DC | PRN

## 2015-06-29 MED ORDER — SODIUM CHLORIDE 0.9% FLUSH: 3.0000 mL | Freq: Two times a day (BID) | INTRAVENOUS | Status: DC

## 2015-06-29 MED ORDER — LACTATED RINGERS IV SOLN
INTRAVENOUS | Status: DC
  Administered 2015-06-29 (×2): via INTRAVENOUS

## 2015-06-29 MED ORDER — MIDAZOLAM HCL 5 MG/5ML IJ SOLN
INTRAMUSCULAR | Status: DC | PRN
  Administered 2015-06-29: 0.5 mg via INTRAVENOUS

## 2015-06-29 MED ORDER — ROCURONIUM BROMIDE 50 MG/5ML IV SOLN
INTRAVENOUS | Status: AC
  Filled 2015-06-29: qty 1

## 2015-06-29 MED ORDER — LIDOCAINE HCL (CARDIAC) 20 MG/ML IV SOLN
INTRAVENOUS | Status: DC | PRN
  Administered 2015-06-29: 60 mg via INTRAVENOUS

## 2015-06-29 MED ORDER — ACETAMINOPHEN 650 MG RE SUPP: 650.0000 mg | RECTAL | Status: DC | PRN

## 2015-06-29 MED ORDER — PANTOPRAZOLE SODIUM 40 MG PO TBEC
40.0000 mg | DELAYED_RELEASE_TABLET | Freq: Every day | ORAL | Status: DC
  Administered 2015-06-29 – 2015-06-30 (×2): 40 mg via ORAL
  Filled 2015-06-29 (×2): qty 1

## 2015-06-29 MED ORDER — MIDAZOLAM HCL 2 MG/2ML IJ SOLN
INTRAMUSCULAR | Status: AC
  Filled 2015-06-29: qty 2

## 2015-06-29 MED ORDER — NEOSTIGMINE METHYLSULFATE 10 MG/10ML IV SOLN
INTRAVENOUS | Status: AC
  Filled 2015-06-29: qty 1

## 2015-06-29 MED ORDER — ONDANSETRON HCL 4 MG/2ML IJ SOLN
4.0000 mg | INTRAMUSCULAR | Status: DC | PRN
  Administered 2015-06-29: 4 mg via INTRAVENOUS
  Filled 2015-06-29: qty 2

## 2015-06-29 MED ORDER — SODIUM CHLORIDE 0.45 % IV SOLN
INTRAVENOUS | Status: DC
  Administered 2015-06-29: 21:00:00 via INTRAVENOUS

## 2015-06-29 MED ORDER — PROPOFOL 10 MG/ML IV BOLUS
INTRAVENOUS | Status: AC
  Filled 2015-06-29: qty 40

## 2015-06-29 MED ORDER — PROPOFOL 10 MG/ML IV BOLUS
INTRAVENOUS | Status: DC | PRN
  Administered 2015-06-29: 100 mg via INTRAVENOUS

## 2015-06-29 MED ORDER — ACETAMINOPHEN 325 MG PO TABS: 650.0000 mg | ORAL_TABLET | ORAL | Status: DC | PRN

## 2015-06-29 MED ORDER — CEFAZOLIN SODIUM 1-5 GM-% IV SOLN
1.0000 g | Freq: Three times a day (TID) | INTRAVENOUS | Status: AC
  Administered 2015-06-30 (×2): 1 g via INTRAVENOUS
  Filled 2015-06-29 (×2): qty 50

## 2015-06-29 MED ORDER — GLYCOPYRROLATE 0.2 MG/ML IJ SOLN
INTRAMUSCULAR | Status: DC | PRN
  Administered 2015-06-29: 0.6 mg via INTRAVENOUS

## 2015-06-29 MED ORDER — POLYETHYLENE GLYCOL 3350 17 G PO PACK: 17.0000 g | PACK | Freq: Every day | ORAL | Status: DC | PRN

## 2015-06-29 MED ORDER — METHOCARBAMOL 500 MG PO TABS
500.0000 mg | ORAL_TABLET | Freq: Four times a day (QID) | ORAL | Status: DC | PRN
  Administered 2015-06-30 (×2): 500 mg via ORAL
  Filled 2015-06-29 (×3): qty 1

## 2015-06-29 MED ORDER — GLYCOPYRROLATE 0.2 MG/ML IJ SOLN
INTRAMUSCULAR | Status: AC
  Filled 2015-06-29: qty 3

## 2015-06-29 MED ORDER — ONDANSETRON HCL 4 MG/2ML IJ SOLN
INTRAMUSCULAR | Status: AC
  Filled 2015-06-29: qty 2

## 2015-06-29 MED ORDER — 0.9 % SODIUM CHLORIDE (POUR BTL) OPTIME
TOPICAL | Status: DC | PRN
  Administered 2015-06-29: 1000 mL

## 2015-06-29 MED ORDER — HYDROMORPHONE HCL 1 MG/ML IJ SOLN
0.5000 mg | INTRAMUSCULAR | Status: DC | PRN
  Administered 2015-06-30 (×2): 0.5 mg via INTRAVENOUS
  Filled 2015-06-29 (×2): qty 1

## 2015-06-29 SURGICAL SUPPLY — 46 items
ADH SKN CLS APL DERMABOND .7 (GAUZE/BANDAGES/DRESSINGS) ×1
BUR ROUND FLUTED 4 SOFT TCH (BURR) ×1 IMPLANT
BUR ROUND FLUTED 4MM SOFT TCH (BURR) ×1
CANISTER SUCT 3000ML PPV (MISCELLANEOUS) ×2 IMPLANT
CANISTER SUCTION 2500CC (MISCELLANEOUS) ×2 IMPLANT
CLOSURE STERI-STRIP 1/2X4 (GAUZE/BANDAGES/DRESSINGS) ×1
CLSR STERI-STRIP ANTIMIC 1/2X4 (GAUZE/BANDAGES/DRESSINGS) ×2 IMPLANT
COVER SURGICAL LIGHT HANDLE (MISCELLANEOUS) ×3 IMPLANT
DERMABOND ADVANCED (GAUZE/BANDAGES/DRESSINGS) ×2
DERMABOND ADVANCED .7 DNX12 (GAUZE/BANDAGES/DRESSINGS) ×1 IMPLANT
DRAPE MICROSCOPE LEICA (MISCELLANEOUS) ×3 IMPLANT
DRAPE PROXIMA HALF (DRAPES) ×6 IMPLANT
DRSG MEPILEX BORDER 4X4 (GAUZE/BANDAGES/DRESSINGS) ×3 IMPLANT
DRSG MEPILEX BORDER 4X8 (GAUZE/BANDAGES/DRESSINGS) ×3 IMPLANT
DURAPREP 26ML APPLICATOR (WOUND CARE) ×3 IMPLANT
ELECT REM PT RETURN 9FT ADLT (ELECTROSURGICAL) ×3
ELECTRODE REM PT RTRN 9FT ADLT (ELECTROSURGICAL) ×1 IMPLANT
GLOVE BIOGEL PI IND STRL 8 (GLOVE) ×2 IMPLANT
GLOVE BIOGEL PI INDICATOR 8 (GLOVE) ×4
GLOVE ORTHO TXT STRL SZ7.5 (GLOVE) ×6 IMPLANT
GOWN STRL REUS W/ TWL LRG LVL3 (GOWN DISPOSABLE) ×2 IMPLANT
GOWN STRL REUS W/ TWL XL LVL3 (GOWN DISPOSABLE) ×1 IMPLANT
GOWN STRL REUS W/TWL 2XL LVL3 (GOWN DISPOSABLE) ×3 IMPLANT
GOWN STRL REUS W/TWL LRG LVL3 (GOWN DISPOSABLE) ×6
GOWN STRL REUS W/TWL XL LVL3 (GOWN DISPOSABLE) ×3
KIT BASIN OR (CUSTOM PROCEDURE TRAY) ×3 IMPLANT
KIT ROOM TURNOVER OR (KITS) ×3 IMPLANT
MANIFOLD NEPTUNE II (INSTRUMENTS) ×1 IMPLANT
NDL HYPO 25GX1X1/2 BEV (NEEDLE) ×1 IMPLANT
NDL SPNL 18GX3.5 QUINCKE PK (NEEDLE) ×1 IMPLANT
NEEDLE HYPO 25GX1X1/2 BEV (NEEDLE) ×3 IMPLANT
NEEDLE SPNL 18GX3.5 QUINCKE PK (NEEDLE) ×3 IMPLANT
NS IRRIG 1000ML POUR BTL (IV SOLUTION) ×3 IMPLANT
PACK LAMINECTOMY ORTHO (CUSTOM PROCEDURE TRAY) ×3 IMPLANT
PAD ARMBOARD 7.5X6 YLW CONV (MISCELLANEOUS) ×6 IMPLANT
PATTIES SURGICAL .5 X.5 (GAUZE/BANDAGES/DRESSINGS) IMPLANT
PATTIES SURGICAL .75X.75 (GAUZE/BANDAGES/DRESSINGS) IMPLANT
SUT BONE WAX W31G (SUTURE) IMPLANT
SUT VIC AB 0 CT1 27 (SUTURE) ×3
SUT VIC AB 0 CT1 27XBRD ANBCTR (SUTURE) ×1 IMPLANT
SUT VIC AB 2-0 CT1 27 (SUTURE) ×3
SUT VIC AB 2-0 CT1 TAPERPNT 27 (SUTURE) ×1 IMPLANT
SUT VIC AB 3-0 X1 27 (SUTURE) IMPLANT
TOWEL OR 17X24 6PK STRL BLUE (TOWEL DISPOSABLE) ×3 IMPLANT
TOWEL OR 17X26 10 PK STRL BLUE (TOWEL DISPOSABLE) ×3 IMPLANT
WATER STERILE IRR 1000ML POUR (IV SOLUTION) ×1 IMPLANT

## 2015-06-29 NOTE — Transfer of Care (Signed)
Immediate Anesthesia Transfer of Care Note  Patient: Stephanie Ingram  Procedure(s) Performed: Procedure(s): Right L3-4 Microdiscectomy (N/A)  Patient Location: PACU  Anesthesia Type:General  Level of Consciousness: awake  Airway & Oxygen Therapy: Patient Spontanous Breathing and Patient connected to nasal cannula oxygen  Post-op Assessment: Report given to RN and Post -op Vital signs reviewed and stable  Post vital signs: Reviewed and stable  Last Vitals:  Filed Vitals:   06/29/15 1306  BP: 139/83  Pulse: 92  Temp: 37.1 C  Resp: 20    Complications: No apparent anesthesia complications

## 2015-06-29 NOTE — Interval H&P Note (Signed)
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 After consideration of risks, benefits and other options for treatment, the patient has consented to  Procedure(s): Right L3-4 Microdiscectomy (N/A) as a surgical intervention .  The patient's history has been reviewed, patient examined, no change in status, stable for surgery.  I have reviewed the patient's chart and labs.  Questions were answered to the patient's satisfaction.     Loa Idler C

## 2015-06-29 NOTE — H&P (Signed)
Stephanie Ingram is an 75 y.o. female.    The patient returns for persistent problems with back pain, right leg pain, and right leg weakness.  She states the pain has been severe.  She states she is only able to sleep on her left side.  Her pain has been going on for several months.  She got temporary relief from a cortisone injection.  She is normally followed by Dr. Benedetto GoadFred Wilson.    MEDICATIONS:  Fosamax Advil, calcium, cranberry juice, vitamins and Centrum Silver.  PAST SURGICAL HISTORY:  She had previous lumbar surgery 2 years ago at the L2-3 level for a right HNP with inferior migration.   FAMILY HISTORY:  Positive for diabetes, heart disease, lung cancer with metastatic disease, and hypertension.    SOCIAL HISTORY:   The patient is divorced.   REVIEW OF SYSTEMS:  Positive for arthritis, bladder problems, kidney disease, osteoporosis, previous L2-3 HNP on the right, rheumatic fever, and teeth and gum disease.    Past Medical History  Diagnosis Date  . Hyperlipidemia     takes Tricor daily no longer takes  . Pneumonia 10+yrs ago    hx of  . Joint pain   . Osteoporosis   . Chronic back pain     HNP  . GERD (gastroesophageal reflux disease)     occasionally but doesn't take any meds  . Urinary frequency   . History of shingles   . Short-term memory loss   . Shortness of breath dyspnea     with exertion  . Arthritis   . GI bleeding     Past Surgical History  Procedure Laterality Date  . Tubal ligation    . Colonoscopy    . Lumbar laminectomy Right 09/11/2013    Procedure: MICRODISCECTOMY LUMBAR LAMINECTOMY;  Surgeon: Eldred MangesMark C Yates, MD;  [facility];  Service: Orthopedics;  Laterality: Right;  Right L2-3 Microdiscectomy    No family history on file. Social History:  reports that she has never smoked. She does not have any smokeless tobacco history on file. She reports that she does not drink alcohol or use illicit drugs.  Allergies: No Known Allergies  No  prescriptions prior to admission    No results found for this or any previous visit (from the past 48 hour(s)). No results found.  Review of Systems  Constitutional: Negative.   HENT: Negative.   Eyes: Negative.   Respiratory: Negative.   Cardiovascular: Negative.   Gastrointestinal: Negative.   Genitourinary: Negative.   Musculoskeletal: Positive for back pain.  Skin: Negative.   Psychiatric/Behavioral: Negative.     There were no vitals taken for this visit. Physical Exam  Constitutional: She is oriented to person, place, and time. She appears distressed.  HENT:  Head: Atraumatic.  Eyes: EOM are normal.  Neck: Normal range of motion.  Cardiovascular: Normal rate.   Respiratory: No respiratory distress.  GI: She exhibits no distension.  Musculoskeletal: She exhibits tenderness.  Neurological: She is alert and oriented to person, place, and time.  Skin: Skin is warm and dry.  Psychiatric: She has a normal mood and affect.    PHYSICAL EXAMINATION:  The patient is awake and alert.  Height 5 feet 5 inches.  Weight 163 pounds.  Extraocular movements are intact.  Upper extremity reflexes are 2+.  Lungs are clear.  Heart regular rate and rhythm.  No abdominal tenderness.  She has significant right-sided sciatic notch tenderness.  Right quad weakness is present.  No hip  flexion weakness.  Anterior tib and EHL are strong.   RADIOGRAPHS:  MRI scan is reviewed.  No evidence of recurrence at the L2-3 level.  At L3-4, she has HNP 8 mm in size encroaching on the lateral recess and also the neural foramina with superior migration and inferior extrusion of the fragment with short pedicles causing high-grade central stenosis.  Other levels show mild and moderate narrowing.   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     PLAN:  We discussed options.  She would like to proceed with operative intervention.  Risks of surgery discussed.  Procedure  discussed.  She has had the previous surgery 2 years ago and is familiar with it.  Plan to be an overnight stay.  All questions answered.  She understands and wishes to proceed.  Naida Sleight, PA-C 06/29/2015, 8:54 AM

## 2015-06-29 NOTE — Anesthesia Preprocedure Evaluation (Addendum)
Anesthesia Evaluation  Patient identified by MRN, date of birth, ID band Patient awake    Reviewed: Allergy & Precautions, H&P , NPO status , Patient's Chart, lab work & pertinent test results  Airway Mallampati: I   Neck ROM: Full    Dental  (+) Dental Advisory Given, Upper Dentures   Pulmonary    breath sounds clear to auscultation       Cardiovascular negative cardio ROS   Rhythm:Regular Rate:Normal     Neuro/Psych  Headaches,    GI/Hepatic GERD  ,  Endo/Other    Renal/GU      Musculoskeletal   Abdominal   Peds  Hematology   Anesthesia Other Findings Upper de3nture out to labelled cup to daughter  Reproductive/Obstetrics                            Anesthesia Physical  Anesthesia Plan  ASA: II  Anesthesia Plan: General   Post-op Pain Management:    Induction: Intravenous  Airway Management Planned: Oral ETT  Additional Equipment:   Intra-op Plan:   Post-operative Plan: Extubation in OR  Informed Consent: I have reviewed the patients History and Physical, chart, labs and discussed the procedure including the risks, benefits and alternatives for the proposed anesthesia with the patient or authorized representative who has indicated his/her understanding and acceptance.   Dental advisory given  Plan Discussed with: Anesthesiologist, Surgeon and CRNA  Anesthesia Plan Comments:         Anesthesia Quick Evaluation

## 2015-06-29 NOTE — Anesthesia Procedure Notes (Signed)
Procedure Name: Intubation Date/Time: 06/29/2015 3:30 PM Performed by: Marni GriffonJAMES, Korryn Pancoast B Pre-anesthesia Checklist: Emergency Drugs available, Patient identified, Suction available and Patient being monitored Patient Re-evaluated:Patient Re-evaluated prior to inductionOxygen Delivery Method: Circle system utilized Preoxygenation: Pre-oxygenation with 100% oxygen Intubation Type: IV induction Ventilation: Mask ventilation without difficulty Laryngoscope Size: Mac and 3 Grade View: Grade I Tube type: Oral Tube size: 7.5 mm Number of attempts: 1 Airway Equipment and Method: Stylet Placement Confirmation: ETT inserted through vocal cords under direct vision,  positive ETCO2 and breath sounds checked- equal and bilateral Secured at: 21 (cm at upper gum) cm Tube secured with: Tape Dental Injury: Teeth and Oropharynx as per pre-operative assessment

## 2015-06-29 NOTE — Brief Op Note (Signed)
06/29/2015  5:05 PM  PATIENT:  Stephanie Ingram  75 y.o. 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  POST-OPERATIVE DIAGNOSIS:  Right Lumbar 3-4 Herniated Nucleus Pulposus  PROCEDURE:  Procedure(s): Right L3-4 Microdiscectomy (N/A)  SURGEON:  Surgeon(s) and Role:    * Eldred MangesMark C Yates, MD - Primary  PHYSICIAN ASSISTANT: Zonia Kiefjames Georgiana Spillane pa-c   ANESTHESIA:   general  EBL:  Total I/O In: 1000 [I.V.:1000] Out: -   BLOOD ADMINISTERED:none  DRAINS: none   LOCAL MEDICATIONS USED:  MARCAINE     SPECIMEN:  No Specimen  DISPOSITION OF SPECIMEN:  N/A  COUNTS:  YES  TOURNIQUET:  * No tourniquets in log *  DICTATION: .Dragon Dictation  PLAN OF CARE: Admit for overnight observation  PATIENT DISPOSITION:  PACU - hemodynamically stable.

## 2015-06-30 ENCOUNTER — Encounter (HOSPITAL_COMMUNITY): Payer: Self-pay | Admitting: Orthopaedic Surgery

## 2015-06-30 DIAGNOSIS — M960 Pseudarthrosis after fusion or arthrodesis: Secondary | ICD-10-CM | POA: Diagnosis not present

## 2015-06-30 MED ORDER — OXYCODONE-ACETAMINOPHEN 5-325 MG PO TABS: 1.0000 | ORAL_TABLET | Freq: Four times a day (QID) | ORAL | Status: DC | PRN

## 2015-06-30 MED ORDER — METHOCARBAMOL 500 MG PO TABS: 500.0000 mg | ORAL_TABLET | Freq: Three times a day (TID) | ORAL | Status: DC | PRN

## 2015-06-30 NOTE — Care Management Note (Signed)
Case Management Note  Patient Details  Name: Stephanie Ingram MRN: [id number] Date of Birth: 1941/01/07  Subjective/Objective:                S/p right L3 laminotomy    Action/Plan: Spoke with patient about discharge plan, her daughter will be staying with her for a few days and her son stays with her at night. Other family members live within walking distance and will be assisting her as well. Patient stated that she has a rolling walker and a cane. Would like a 3N1t. [facility]1 from [facility] delivered to patient's room.  Expected Discharge Date:                  Expected Discharge Plan:  Home/Self Care  In-House Referral:  NA  Discharge planning Services  CM Consult  Post Acute Care Choice:  Durable Medical Equipment Choice offered to:  Patient  DME Arranged:  3-N-1 DME Agency:  [facility]  HH Arranged:  NA HH Agency:  NA  Status of Service:  Completed, signed off  Medicare Important Message Given:    Date Medicare IM Given:    Medicare IM give by:    Date Additional Medicare IM Given:    Additional Medicare Important Message give by:     If discussed at Long Length of Stay Meetings, dates discussed:    Additional Comments:  Monica BectonKrieg, Ben Sanz Watson, RN 06/30/2015, 10:04 AM

## 2015-06-30 NOTE — Op Note (Signed)
NAMIrineo Axon:  Grimm, Cesilia             ACCOUNT NO.:  [id number]  MEDICAL RECORD NO.:  [id number]  LOCATION:  5N32C                        FACILITY:  [facility]  PHYSICIAN:  Amron Guerrette C. Ophelia CharterYates, M.D.    DATE OF BIRTH:  [birth year]  DATE OF PROCEDURE:  06/29/2015 DATE OF DISCHARGE:                              OPERATIVE REPORT   PREOPERATIVE DIAGNOSIS:  Right L3-4 herniated nucleus pulposus with migrated fragment.  POSTOPERATIVE DIAGNOSIS:  Right L3-4 herniated nucleus pulposus with migrated fragment.  PROCEDURE:  Right L3 laminotomy, removal of herniated nucleus pulposus.  SURGEON:  Jessicalynn Deshong C. Ophelia CharterYates, M.D.  ASSISTANT:  Genene ChurnJames M. Barry Dieneswens, PA-C, medically necessary and present for the entire procedure.  FINDINGS:  Subligamentous fragment partially extruded with migrated cephalad fragment.  INDICATION FOR PROCEDURE:  After induction of general anesthesia, orotracheal intubation, the patient was placed prone.  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  The patient failed conservative treatment and was brought in for microdiskectomy.  DESCRIPTION OF PROCEDURE:  After induction of general anesthesia, prone position on chest rolls, careful padding, back was prepped with DuraPrep.  A 10/10 drape was applied across the S1 level.  Area was squared with towels, Betadine and Steri-Drape applied after the old incision had been marked.  Needle position localization was performed. Cross-table lateral x-ray confirmed the needles were slightly high.  Top needle was switched down inferiorly.  Repeat x-ray was taken, confirming appropriate level at 3-4.  Incision was made a few millimeters to the right, subperiosteal dissection down on the lamina, there was thick scar tissue present, and a  Penfield 4 was placed, and a second x-ray was taken, confirming appropriate level.  The lamina was thinned with the bur.  Inferior aspect was removed with a Kerrison rongeur.  Thick chunks of ligament were removed, and the dura was displaced with fragment __________ migrated, partially retained by the ligament, and at the top portion, the fragment was hanging outside the ligament.  The anulus was incised.  Fragment was removed.  Passes were made through the disk with the dura carefully protected using the operative microscope.  There was minimal remaining fragments once the 2 large fragments were removed. Epstein hockey stick was used passage anterior to the dura with no areas of compression. Lateral recess was completely decompressed.  Bone was removed out to the level of the pedicle after irrigation with saline solution.  Standard layered closure was performed with #1 Vicryl, 2-0 Vicryl, Dermabond on the skin and postop dressing.     Othmar Ringer C. Ophelia CharterYates, M.D.     MCY/MEDQ  D:  06/29/2015  T:  06/30/2015  Job:  [id number]

## 2015-06-30 NOTE — Care Management Obs Status (Signed)
MEDICARE OBSERVATION STATUS NOTIFICATION   Patient Details  Name: Stephanie CokeMozelle M Twombly MRN: [id number] Date of Birth: 08-22-1940   Medicare Observation Status Notification Given:  Yes    Monica BectonKrieg, Zynasia Burklow Watson, RN 06/30/2015, 10:19 AM

## 2015-06-30 NOTE — Progress Notes (Signed)
Spoke with Dr. Ophelia CharterYates on the phone about how well pt was doing today. Pt ambulating and tolerating pain with PRN pain medications, VSS, and no n/v. MD stated it was okay to discharge pt today and to follow up in 1 week. Discharge instructions and prescriptions were given to patient and family. All questions were answered.

## 2015-07-02 NOTE — Anesthesia Postprocedure Evaluation (Signed)
Anesthesia Post Note  Patient: Stephanie Ingram  Procedure(s) Performed: Procedure(s) (LRB): Right L3-4 Microdiscectomy (N/A)  Patient location during evaluation: PACU Anesthesia Type: General Level of consciousness: awake and alert Pain management: pain level controlled Vital Signs Assessment: post-procedure vital signs reviewed and stable Respiratory status: spontaneous breathing, nonlabored ventilation, respiratory function stable and patient connected to nasal cannula oxygen Cardiovascular status: blood pressure returned to baseline and stable Postop Assessment: no signs of nausea or vomiting Anesthetic complications: no                   Reino KentJudd, Myah Guynes J

## 2015-07-07 NOTE — Discharge Summary (Signed)
Patient ID: Stephanie Ingram MRN: [id number] DOB/AGE: 11/03/1940 75 y.o.  Admit date: 06/29/2015 Discharge date: 07/07/2015  Admission Diagnoses:  Active Problems:   S/P lumbar discectomy   Discharge Diagnoses:  Active Problems:   S/P lumbar discectomy  status post Procedure(s): Right L3-4 Microdiscectomy  Past Medical History  Diagnosis Date  . Hyperlipidemia     takes Tricor daily no longer takes  . Pneumonia 10+yrs ago    hx of  . Joint pain   . Osteoporosis   . Chronic back pain     HNP  . GERD (gastroesophageal reflux disease)     occasionally but doesn't take any meds  . Urinary frequency   . History of shingles   . Short-term memory loss   . Shortness of breath dyspnea     with exertion  . Arthritis   . GI bleeding     Surgeries: Procedure(s): Right L3-4 Microdiscectomy on 06/29/2015   Consultants:    Discharged Condition: Improved  Hospital Course: Stephanie Ingram is an 75 y.o. female who was admitted 06/29/2015 for operative treatment of lumbar HNP. Patient failed conservative treatments (please see the history and physical for the specifics) and had severe unremitting pain that affects sleep, daily activities and work/hobbies. After pre-op clearance, the patient was taken to the operating room on 06/29/2015 and underwent  Procedure(s): Right L3-4 Microdiscectomy.    Patient was given perioperative antibiotics:  Anti-infectives    Start     Dose/Rate Route Frequency Ordered Stop   06/29/15 2330  ceFAZolin (ANCEF) IVPB 1 g/50 mL premix     1 g 100 mL/hr over 30 Minutes Intravenous Every 8 hours 06/29/15 1845 06/30/15 0810   06/29/15 1304  ceFAZolin (ANCEF) 2-4 GM/100ML-% IVPB    Comments:  [place] [facility]   : cabinet override      06/29/15 1304 06/30/15 0114   06/29/15 0600  ceFAZolin (ANCEF) 2 g in dextrose 5 % 50 mL IVPB     2 g 140 mL/hr over 30 Minutes Intravenous On call to O.R. 06/28/15 1457 06/29/15 1550       Patient was given sequential  compression devices and early ambulation to prevent DVT.   Patient benefited maximally from hospital stay and there were no complications. At the time of discharge, the patient was urinating/moving their bowels without difficulty, tolerating a regular diet, pain is controlled with oral pain medications and they have been cleared by PT/OT.   Recent vital signs: No data found.    Recent laboratory studies: No results for input(s): WBC, HGB, HCT, PLT, NA, K, CL, CO2, BUN, CREATININE, GLUCOSE, INR, CALCIUM in the last 72 hours.  Invalid input(s): PT, 2   Discharge Medications:     Medication List    STOP taking these medications        traMADol 50 MG tablet  Commonly known as:  ULTRAM      TAKE these medications        alendronate 70 MG tablet  Commonly known as:  FOSAMAX  Take 70 mg by mouth once a week. Take with a full glass of water on an empty stomach.     aspirin EC 81 MG tablet  Take 81 mg by mouth daily.     calcium carbonate 500 MG chewable tablet  Commonly known as:  TUMS - dosed in mg elemental calcium  Chew 1 tablet by mouth daily.     Cranberry 1000 MG Caps  Take 1 capsule by mouth daily.  fenofibrate 145 MG tablet  Commonly known as:  TRICOR  Take 145 mg by mouth daily.     methocarbamol 500 MG tablet  Commonly known as:  ROBAXIN  Take 1 tablet (500 mg total) by mouth every 8 (eight) hours as needed for muscle spasms.     multivitamin with minerals Tabs tablet  Take 1 tablet by mouth daily.     oxyCODONE-acetaminophen 5-325 MG tablet  Commonly known as:  PERCOCET/ROXICET  Take 1 tablet by mouth every 6 (six) hours as needed for moderate pain.     pantoprazole 40 MG tablet  Commonly known as:  PROTONIX  Take 1 tablet (40 mg total) by mouth daily at 6 (six) AM.     vitamin B-12 1000 MCG tablet  Commonly known as:  CYANOCOBALAMIN  Take 1,000 mcg by mouth daily.        Diagnostic Studies: Dg Chest 2 View  06/24/2015  CLINICAL DATA:   75 year old female with a history of preoperative chest x-ray EXAM: CHEST - 2 VIEW COMPARISON:  09/09/2013 FINDINGS: Cardiomediastinal silhouette unchanged. No confluent airspace disease. Bilateral coarsened interstitial markings, similar to comparison study. No pneumothorax or pleural effusion. Unchanged configuration of thoracic vertebral elements. No displaced fracture identified. Degenerative changes of the shoulders. Unremarkable appearance of the upper abdomen. IMPRESSION: Chronic lung changes with no evidence of superimposed acute cardiopulmonary disease. Signed, Yvone Neu. Loreta Ave, DO [facility] Electronically Signed   By: Gilmer Mor D.O.   On: 06/24/2015 16:18   Dg Lumbar Spine 2-3 Views  06/29/2015  CLINICAL DATA:  L3-4 microdiscectomy. EXAM: LUMBAR SPINE - 2-3 VIEW COMPARISON:  09/11/2013 intraoperative lateral lumbar spine radiographs. FINDINGS: Initial cross-table radiograph demonstrates 2 posterior approach surgical marking devices in the lower back, with the most superior of which terminating over the upper L4 spinous process in the most inferior of which terminating over the L5 spinous process. Subsequent cross-table radiograph demonstrates 2 posterior approach surgical marking devices, with the most superior of which terminating over the upper L3 spinous process and the most inferior of which terminating over the L3-4 interspinous space. Final cross-table radiograph demonstrates posterior approach surgical marking device with the tip at the posterior margin of the spinal canal at the lower L3 vertebral level. IMPRESSION: Posterior approach surgical marking device positions as described. Electronically Signed   By: Delbert Phenix M.D.   On: 06/29/2015 18:16        Discharge Instructions    Call MD / Call 911    Complete by:  As directed   If you experience chest pain or shortness of breath, CALL 911 and be transported to the hospital  emergency room.  If you develope a fever above 101 F, pus (white drainage) or increased drainage or redness at the wound, or calf pain, call your surgeon's office.     Constipation Prevention    Complete by:  As directed   Drink plenty of fluids.  Prune juice may be helpful.  You may use a stool softener, such as Colace (over the counter) 100 mg twice a day.  Use MiraLax (over the counter) for constipation as needed.     Diet - low sodium heart healthy    Complete by:  As directed      Discharge instructions    Complete by:  As directed   Ok to shower 5 days postop.  Do not apply any creams or ointments to incision.  Do not remove steri-strips.  Can  use 4x4 gauze and tape for dressing changes.  No aggressive activity.  No bending, squatting or prolonged sitting.  Mostly be in reclined position or lying down.     Driving restrictions    Complete by:  As directed   No driving     Increase activity slowly as tolerated    Complete by:  As directed      Lifting restrictions    Complete by:  As directed   No lifting             Discharge Plan:  discharge to home  Disposition:     Signed: Naida SleightWENS,Rejina Odle M  07/07/2015, 4:03 PM

## 2016-02-19 ENCOUNTER — Other Ambulatory Visit: Payer: Self-pay | Admitting: Family Medicine

## 2016-02-19 DIAGNOSIS — Z1231 Encounter for screening mammogram for malignant neoplasm of breast: Secondary | ICD-10-CM

## 2016-03-15 ENCOUNTER — Ambulatory Visit
Admission: RE | Admit: 2016-03-15 | Discharge: 2016-03-15 | Disposition: A | Discharge status: Home / Self Care | Payer: Medicare HMO | Source: Ambulatory Visit | Attending: Family Medicine | Admitting: Family Medicine

## 2016-03-15 DIAGNOSIS — Z1231 Encounter for screening mammogram for malignant neoplasm of breast: Secondary | ICD-10-CM

## 2017-01-31 ENCOUNTER — Encounter (HOSPITAL_COMMUNITY): Payer: Self-pay | Admitting: *Deleted

## 2017-01-31 ENCOUNTER — Other Ambulatory Visit: Payer: Self-pay

## 2017-01-31 ENCOUNTER — Emergency Department (HOSPITAL_COMMUNITY)
Admission: EM | Admit: 2017-01-31 | Discharge: 2017-01-31 | Disposition: A | Discharge status: Home / Self Care | Payer: Medicare HMO | Attending: Emergency Medicine | Admitting: Emergency Medicine

## 2017-01-31 DIAGNOSIS — Z79899 Other long term (current) drug therapy: Secondary | ICD-10-CM | POA: Diagnosis not present

## 2017-01-31 DIAGNOSIS — R531 Weakness: Secondary | ICD-10-CM | POA: Insufficient documentation

## 2017-01-31 DIAGNOSIS — K625 Hemorrhage of anus and rectum: Secondary | ICD-10-CM

## 2017-01-31 DIAGNOSIS — Z7982 Long term (current) use of aspirin: Secondary | ICD-10-CM | POA: Diagnosis not present

## 2017-01-31 LAB — TYPE AND SCREEN
ABO/RH(D): O POS
ANTIBODY SCREEN: NEGATIVE

## 2017-01-31 LAB — COMPREHENSIVE METABOLIC PANEL
ALBUMIN: 3.7 g/dL (ref 3.5–5.0)
ALT: 13 U/L — ABNORMAL LOW (ref 14–54)
ANION GAP: 9 (ref 5–15)
AST: 17 U/L (ref 15–41)
Alkaline Phosphatase: 46 U/L (ref 38–126)
BUN: 14 mg/dL (ref 6–20)
CO2: 25 mmol/L (ref 22–32)
Calcium: 9 mg/dL (ref 8.9–10.3)
Chloride: 106 mmol/L (ref 101–111)
Creatinine, Ser: 0.83 mg/dL (ref 0.44–1.00)
GFR calc non Af Amer: >60 mL/min (ref >60)
GLUCOSE: 118 mg/dL — AB (ref 65–99)
POTASSIUM: 4.6 mmol/L (ref 3.5–5.1)
SODIUM: 140 mmol/L (ref 135–145)
TOTAL PROTEIN: 6.9 g/dL (ref 6.5–8.1)
Total Bilirubin: 0.5 mg/dL (ref 0.3–1.2)

## 2017-01-31 LAB — CBC
HCT: 41.8 % (ref 36.0–46.0)
Hemoglobin: 13.4 g/dL (ref 12.0–15.0)
MCH: 30.4 pg (ref 26.0–34.0)
MCHC: 32.1 g/dL (ref 30.0–36.0)
MCV: 94.8 fL (ref 78.0–100.0)
Platelets: 233 K/uL (ref 150–400)
RBC: 4.41 MIL/uL (ref 3.87–5.11)
RDW: 13.8 % (ref 11.5–15.5)
WBC: 5.6 K/uL (ref 4.0–10.5)

## 2017-01-31 NOTE — Discharge Instructions (Signed)
As discussed, your evaluation today has been largely reassuring.  But, it is important that you monitor your condition carefully, and do not hesitate to return to the ED if you develop new, or concerning changes in your condition. ? ?Otherwise, please follow-up with your physician for appropriate ongoing care. ? ?

## 2017-01-31 NOTE — ED Triage Notes (Signed)
Pt c/o red clots and blood in stool x 2 days with 4 episodes, hx of the same, pt seen at PCP and sent here for lower GI bleed, pt c/o weakness, denies SOB at this time, denies n/v/d, A&O x4

## 2017-01-31 NOTE — ED Provider Notes (Signed)
[facility] EMERGENCY DEPARTMENT Provider Note   CSN: [id number] Arrival date & time: 01/31/17  6295     History   Chief Complaint Chief Complaint  Patient presents with  . Weakness  . GI Bleeding    HPI Stephanie Ingram is a 76 y.o. female.  HPI  Patient presents after episodes of rectal bleeding. Patient was in her usual state of health until 2 days ago, when she developed the first of 2 or 3 episodes of blood with stool during defecation. There is some associated rectal pain, but no abdominal pain, no concurrent nausea and vomiting or anorexia. Today the patient has had a bowel movement with no blood, no pain. Due to the after mentioned episodes of bleeding she went to her primary care physician's office, and was referred by nursing staff to come here for evaluation. Currently patient states that she feels generally well, has no chest pain, no abdominal pain, no confusion, disorientation, no dyspnea. Patient has had one prior similar episode about 2 years ago, but did not have colonoscopy afterwards.    Past Medical History:  Diagnosis Date  . Arthritis   . Chronic back pain    HNP  . GERD (gastroesophageal reflux disease)    occasionally but doesn't take any meds  . GI bleeding   . History of shingles   . Hyperlipidemia    takes Tricor daily no longer takes  . Joint pain   . Osteoporosis   . Pneumonia 10+yrs ago   hx of  . Short-term memory loss   . Shortness of breath dyspnea    with exertion  . Urinary frequency     Patient Active Problem List   Diagnosis Date Noted  . S/P lumbar discectomy 06/29/2015  . Lower GI bleed 05/07/2015  . Hematochezia 05/07/2015  . Chronic lower back pain 05/07/2015  . HLD (hyperlipidemia) 05/07/2015  . HNP (herniated nucleus pulposus), lumbar 09/11/2013  . UTI (urinary tract infection) 09/11/2013    Past Surgical History:  Procedure Laterality Date  . COLONOSCOPY    . LUMBAR LAMINECTOMY Right  09/11/2013   Procedure: MICRODISCECTOMY LUMBAR LAMINECTOMY;  Surgeon: Eldred Manges, MD;  Location: [facility] OR;  Service: Orthopedics;  Laterality: Right;  Right L2-3 Microdiscectomy  . LUMBAR LAMINECTOMY/DECOMPRESSION MICRODISCECTOMY N/A 06/29/2015   Procedure: Right L3-4 Microdiscectomy;  Surgeon: Eldred Manges, MD;  Location: [facility] OR;  Service: Orthopedics;  Laterality: N/A;  . TUBAL LIGATION      OB History    No data available       Home Medications    Prior to Admission medications   Medication Sig Start Date End Date Taking? Authorizing Provider  alendronate (FOSAMAX) 70 MG tablet Take 70 mg by mouth once a week. Take with a full glass of water on an empty stomach.    [provider]  aspirin EC 81 MG tablet Take 81 mg by mouth daily.    [provider]  calcium carbonate (TUMS - DOSED IN MG ELEMENTAL CALCIUM) 500 MG chewable tablet Chew 1 tablet by mouth daily.    [provider]  Cranberry 1000 MG CAPS Take 1 capsule by mouth daily.    [provider]  fenofibrate (TRICOR) 145 MG tablet Take 145 mg by mouth daily.    [provider]  methocarbamol (ROBAXIN) 500 MG tablet Take 1 tablet (500 mg total) by mouth every 8 (eight) hours as needed for muscle spasms. 06/30/15   Naida Sleight, PA-C  Multiple Vitamin (MULTIVITAMIN WITH MINERALS) TABS tablet Take 1 tablet by mouth daily.    [provider]  oxyCODONE-acetaminophen (PERCOCET/ROXICET) 5-325 MG tablet Take 1 tablet by mouth every 6 (six) hours as needed for moderate pain. 06/30/15   Naida Sleight, PA-C  pantoprazole (PROTONIX) 40 MG tablet Take 1 tablet (40 mg total) by mouth daily at 6 (six) AM. Patient taking differently: Take 40 mg by mouth daily.  05/08/15   Rai, Delene Ruffini, MD  vitamin B-12 (CYANOCOBALAMIN) 1000 MCG tablet Take 1,000 mcg by mouth daily.    [provider]    Family History No family history on file.  Social History Social History  Substance Use  Topics  . Smoking status: Never Smoker  . Smokeless tobacco: Never Used  . Alcohol use No     Allergies   Patient has no known allergies.   Review of Systems Review of Systems  Constitutional:       Per HPI, otherwise negative  HENT:       Per HPI, otherwise negative  Respiratory:       Per HPI, otherwise negative  Cardiovascular:       Per HPI, otherwise negative  Gastrointestinal: Negative for abdominal pain and vomiting.  Endocrine:       Negative aside from HPI  Genitourinary:       Neg aside from HPI   Musculoskeletal:       Per HPI, otherwise negative  Skin: Negative.   Neurological: Negative for syncope.     Physical Exam Updated Vital Signs BP 137/75 (BP Location: Right Arm)   Pulse 76   Temp 98.1 F (36.7 C) (Oral)   Resp 19   Ht 5\' 4"  (1.626 m)   Wt 74.4 kg (164 lb)   SpO2 95%   BMI 28.15 kg/m   Physical Exam  Constitutional: She is oriented to person, place, and time. She appears well-developed and well-nourished. No distress.  HENT:  Head: Normocephalic and atraumatic.  Eyes: Conjunctivae and EOM are normal.  Cardiovascular: Normal rate and regular rhythm.   Pulmonary/Chest: Effort normal and breath sounds normal. No stridor. No respiratory distress.  Abdominal: She exhibits no distension and no mass. There is no tenderness. There is no rebound and no guarding.  Musculoskeletal: She exhibits no edema.  Neurological: She is alert and oriented to person, place, and time. No cranial nerve deficit.  Skin: Skin is warm and dry.  Psychiatric: She has a normal mood and affect.  Nursing note and vitals reviewed.    ED Treatments / Results  Labs (all labs ordered are listed, but only abnormal results are displayed) Labs Reviewed  COMPREHENSIVE METABOLIC PANEL - Abnormal; Notable for the following:       Result Value   Glucose, Bld 118 (*)    ALT 13 (*)    All other components within normal limits  CBC  POC OCCULT BLOOD, ED  TYPE AND SCREEN      EKG  EKG Interpretation  Date/Time:  Tuesday January 31 2017 14:17:50 EDT Ventricular Rate:  79 PR Interval:    QRS Duration: 82 QT Interval:  382 QTC Calculation: 438 R Axis:   78 Text Interpretation:  Sinus rhythm Low voltage, precordial leads Baseline wander in lead(s) V3 Abnormal ekg Confirmed by Gerhard Munch [phone]) on 01/31/2017 2:34:46 PM        Procedures Procedures (including critical care time)    Initial Impression / Assessment and Plan / ED Course  I  have reviewed the triage vital signs and the nursing notes.  Pertinent labs & imaging results that were available during my care of the patient were reviewed by me and considered in my medical decision making (see chart for details).   On repeat exam the patient is in no distress, she is awake, alert, speaking clearly, remains hemodynamically unremarkable. Patient is aware of all findings, and given reassuring results, no evidence for substantial anemia, no evidence for ongoing bleed given her description of no additional rectal bleeding today, the patient was discharged in stable condition to follow-up with gastroenterology.   Final Clinical Impressions(s) / ED Diagnoses  Rectal bleeding   Gerhard MunchLockwood, Daymeon Fischman, MD 01/31/17 1436

## 2017-02-02 ENCOUNTER — Other Ambulatory Visit: Payer: Self-pay | Admitting: Gastroenterology

## 2017-02-03 ENCOUNTER — Ambulatory Visit (HOSPITAL_COMMUNITY): Payer: Medicare HMO | Admitting: Anesthesiology

## 2017-02-03 ENCOUNTER — Ambulatory Visit (HOSPITAL_COMMUNITY)
Admission: RE | Admit: 2017-02-03 | Discharge: 2017-02-03 | Disposition: A | Discharge status: Home / Self Care | Payer: Medicare HMO | Source: Ambulatory Visit | Attending: Gastroenterology | Admitting: Gastroenterology

## 2017-02-03 ENCOUNTER — Encounter (HOSPITAL_COMMUNITY)
Admission: RE | Disposition: A | Discharge status: Home / Self Care | Payer: Self-pay | Source: Ambulatory Visit | Attending: Gastroenterology

## 2017-02-03 ENCOUNTER — Encounter (HOSPITAL_COMMUNITY): Payer: Self-pay | Admitting: *Deleted

## 2017-02-03 DIAGNOSIS — G8929 Other chronic pain: Secondary | ICD-10-CM | POA: Diagnosis not present

## 2017-02-03 DIAGNOSIS — K921 Melena: Secondary | ICD-10-CM | POA: Insufficient documentation

## 2017-02-03 DIAGNOSIS — E785 Hyperlipidemia, unspecified: Secondary | ICD-10-CM | POA: Insufficient documentation

## 2017-02-03 DIAGNOSIS — K219 Gastro-esophageal reflux disease without esophagitis: Secondary | ICD-10-CM | POA: Diagnosis not present

## 2017-02-03 DIAGNOSIS — K5730 Diverticulosis of large intestine without perforation or abscess without bleeding: Secondary | ICD-10-CM | POA: Diagnosis not present

## 2017-02-03 DIAGNOSIS — M1990 Unspecified osteoarthritis, unspecified site: Secondary | ICD-10-CM | POA: Insufficient documentation

## 2017-02-03 HISTORY — DX: Other specified postprocedural states: R11.2

## 2017-02-03 HISTORY — DX: Other specified postprocedural states: Z98.890

## 2017-02-03 HISTORY — PX: COLONOSCOPY: SHX5424

## 2017-02-03 SURGERY — COLONOSCOPY
Anesthesia: Monitor Anesthesia Care

## 2017-02-03 MED ORDER — PROPOFOL 500 MG/50ML IV EMUL
INTRAVENOUS | Status: DC | PRN
  Administered 2017-02-03: 150 ug/kg/min via INTRAVENOUS

## 2017-02-03 MED ORDER — PROPOFOL 10 MG/ML IV BOLUS
INTRAVENOUS | Status: DC | PRN
  Administered 2017-02-03: 20 mg via INTRAVENOUS

## 2017-02-03 MED ORDER — ONDANSETRON HCL 4 MG/2ML IJ SOLN
INTRAMUSCULAR | Status: AC
  Filled 2017-02-03: qty 2

## 2017-02-03 MED ORDER — PROPOFOL 10 MG/ML IV BOLUS
INTRAVENOUS | Status: AC
  Filled 2017-02-03: qty 40

## 2017-02-03 MED ORDER — SODIUM CHLORIDE 0.9 % IV SOLN: INTRAVENOUS | Status: DC

## 2017-02-03 MED ORDER — LACTATED RINGERS IV SOLN
INTRAVENOUS | Status: DC
  Administered 2017-02-03: 1000 mL via INTRAVENOUS

## 2017-02-03 MED ORDER — ONDANSETRON HCL 4 MG/2ML IJ SOLN
INTRAMUSCULAR | Status: DC | PRN
  Administered 2017-02-03: 4 mg via INTRAVENOUS

## 2017-02-03 NOTE — Discharge Instructions (Signed)
YOU HAD AN ENDOSCOPIC PROCEDURE TODAY: Refer to the procedure report and other information in the discharge instructions given to you for any specific questions about what was found during the examination. If this information does not answer your questions, please call [facility] at [phone] to clarify.   YOU SHOULD EXPECT: Some feelings of bloating in the abdomen. Passage of more gas than usual. Walking can help get rid of the air that was put into your GI tract during the procedure and reduce the bloating. If you had a lower endoscopy (such as a colonoscopy or flexible sigmoidoscopy) you may notice spotting of blood in your stool or on the toilet paper. Some abdominal soreness may be present for a day or two, also.  DIET: Your first meal following the procedure should be a light meal and then it is ok to progress to your normal diet. A half-sandwich or bowl of soup is an example of a good first meal. Heavy or fried foods are harder to digest and may make you feel nauseous or bloated. Drink plenty of fluids but you should avoid alcoholic beverages for 24 hours. If you had an esophageal dilation, please see attached information for diet.   ACTIVITY: Your care partner should take you home directly after the procedure. You should plan to take it easy, moving slowly for the rest of the day. You can resume normal activity the day after the procedure however YOU SHOULD NOT DRIVE, use power tools, machinery or perform tasks that involve climbing or major physical exertion for 24 hours (because of the sedation medicines used during the test).   SYMPTOMS TO REPORT IMMEDIATELY: A gastroenterologist can be reached at any hour. Please call [phone]  for any of the following symptoms:  Following lower endoscopy (colonoscopy, flexible sigmoidoscopy) Excessive amounts of blood in the stool  Significant tenderness, worsening of abdominal pains  Swelling of the abdomen that is new, acute  Fever of  100 or higher  Following upper endoscopy (EGD, EUS, ERCP, esophageal dilation) Vomiting of blood or coffee ground material  New, significant abdominal pain  New, significant chest pain or pain under the shoulder blades  Painful or persistently difficult swallowing  New shortness of breath  Black, tarry-looking or red, bloody stools  FOLLOW UP:  If any biopsies were taken you will be contacted by phone or by letter within the next 1-3 weeks. Call [phone]  if you have not heard about the biopsies in 3 weeks.  Please also call with any specific questions about appointments or follow up tests.    Monitored Anesthesia Care, Care After These instructions provide you with information about caring for yourself after your procedure. Your health care provider may also give you more specific instructions. Your treatment has been planned according to current medical practices, but problems sometimes occur. Call your health care provider if you have any problems or questions after your procedure. What can I expect after the procedure? After your procedure, it is common to:  Feel sleepy for several hours.  Feel clumsy and have poor balance for several hours.  Feel forgetful about what happened after the procedure.  Have poor judgment for several hours.  Feel nauseous or vomit.  Have a sore throat if you had a breathing tube during the procedure.  Follow these instructions at home: For at least 24 hours after the procedure:   Do not: ? Participate in activities in which you could fall or become injured. ? Drive. ? Use heavy  machinery. ? Drink alcohol. ? Take sleeping pills or medicines that cause drowsiness. ? Make important decisions or sign legal documents. ? Take care of children on your own.  Rest. Eating and drinking  Follow the diet that is recommended by your health care provider.  If you vomit, drink water, juice, or soup when you can drink without vomiting.  Make  sure you have little or no nausea before eating solid foods. General instructions  Have a responsible adult stay with you until you are awake and alert.  Take over-the-counter and prescription medicines only as told by your health care provider.  If you smoke, do not smoke without supervision.  Keep all follow-up visits as told by your health care provider. This is important. Contact a health care provider if:  You keep feeling nauseous or you keep vomiting.  You feel light-headed.  You develop a rash.  You have a fever. Get help right away if:  You have trouble breathing. This information is not intended to replace advice given to you by your health care provider. Make sure you discuss any questions you have with your health care provider. Document Released: 07/12/2015 Document Revised: 11/11/2015 Document Reviewed: 07/12/2015 Elsevier Interactive Patient Education  [employer].

## 2017-02-03 NOTE — Op Note (Signed)
[facility] Patient Name: Stephanie Ingram Procedure Date: 02/03/2017 MRN: [id number] Attending MD: Jeani Hawking , MD Date of Birth: 07/07/1940 CSN: [id number] Age: 76 Admit Type: Outpatient Procedure:                Colonoscopy Indications:              Hematochezia Providers:                Jeani Hawking, MD, Janae Sauce. Steele Berg, RN, Kandice Robinsons, Technician Referring MD:              Medicines:                Propofol per Anesthesia Complications:            No immediate complications. Estimated Blood Loss:     Estimated blood loss: none. Procedure:                Pre-Anesthesia Assessment:                           - Prior to the procedure, a History and Physical                            was performed, and patient medications and                            allergies were reviewed. The patient's tolerance of                            previous anesthesia was also reviewed. The risks                            and benefits of the procedure and the sedation                            options and risks were discussed with the patient.                            All questions were answered, and informed consent                            was obtained. Prior Anticoagulants: The patient has                            taken no previous anticoagulant or antiplatelet                            agents. ASA Grade Assessment: III - A patient with                            severe systemic disease. After reviewing the risks                            and benefits, the  patient was deemed in                            satisfactory condition to undergo the procedure.                           - Sedation was administered by an anesthesia                            professional. Deep sedation was attained.                           After obtaining informed consent, the colonoscope                            was passed under direct vision. Throughout the                            procedure, the patient's blood pressure, pulse, and                            oxygen saturations were monitored continuously. The                            EC-[id number] ([id number] scope was introduced through                            the anus and advanced to the the cecum, identified                            by appendiceal orifice and ileocecal valve. The                            colonoscopy was performed without difficulty. The                            patient tolerated the procedure well. The quality                            of the bowel preparation was excellent. The                            ileocecal valve, appendiceal orifice, and rectum                            were photographed. Scope In: 10:55:22 AM Scope Out: 11:06:09 AM Scope Withdrawal Time: 0 hours 8 minutes 8 seconds  Total Procedure Duration: 0 hours 10 minutes 47 seconds  Findings:      Scattered small and large-mouthed diverticula were found in the entire       colon. Impression:               - Diverticulosis in the entire examined colon.                           -  No specimens collected. Moderate Sedation:      N/A- Per Anesthesia Care Recommendation:           - Patient has a contact number available for                            emergencies. The signs and symptoms of potential                            delayed complications were discussed with the                            patient. Return to normal activities tomorrow.                            Written discharge instructions were provided to the                            patient.                           - Resume previous diet.                           - Continue present medications.                           - No repeat colonoscopy due to age and the absence                            of advanced adenomas. Procedure Code(s):        --- Professional ---                           [phone], Colonoscopy, flexible; diagnostic,  including                            collection of specimen(s) by brushing or washing,                            when performed (separate procedure) Diagnosis Code(s):        --- Professional ---                           K92.1, Melena (includes Hematochezia)                           K57.30, Diverticulosis of large intestine without                            perforation or abscess without bleeding CPT copyright 2016 [employer]. All rights reserved. The codes documented in this report are preliminary and upon coder review may  be revised to meet current compliance requirements. Jeani Hawking, MD Jeani Hawking, MD 02/03/2017 11:12:05 AM This report has been signed electronically. Number of Addenda: 0

## 2017-02-03 NOTE — Anesthesia Postprocedure Evaluation (Signed)
Anesthesia Post Note  Patient: Stephanie Ingram  Procedure(s) Performed: COLONOSCOPY (N/A )     Patient location during evaluation: PACU Anesthesia Type: MAC Level of consciousness: awake and alert Pain management: pain level controlled Vital Signs Assessment: post-procedure vital signs reviewed and stable Respiratory status: spontaneous breathing, nonlabored ventilation, respiratory function stable and patient connected to nasal cannula oxygen Cardiovascular status: stable and blood pressure returned to baseline Postop Assessment: no apparent nausea or vomiting Anesthetic complications: no    Last Vitals:  Vitals:   02/03/17 1115 02/03/17 1120  BP: 114/62 127/67  Pulse: 74 68  Resp: 20 17  Temp: 36.6 C   SpO2: 100% 98%    Last Pain:  Vitals:   02/03/17 1115  TempSrc: Oral                 Leelah Hanna S

## 2017-02-03 NOTE — Transfer of Care (Signed)
Immediate Anesthesia Transfer of Care Note  Patient: Stephanie Ingram  Procedure(s) Performed: COLONOSCOPY (N/A )  Patient Location: PACU and Endoscopy Unit  Anesthesia Type:MAC  Level of Consciousness: awake, alert , oriented and patient cooperative  Airway & Oxygen Therapy: Patient Spontanous Breathing and patient removed mask from self.  Post-op Assessment: Report given to RN and Post -op Vital signs reviewed and stable  Post vital signs: Reviewed and stable  Last Vitals:  Vitals:   02/03/17 0947 02/03/17 0950  BP: (!) 146/72   Pulse: 76   Resp: 16   Temp:  36.8 C  SpO2: 93%     Last Pain:  Vitals:   02/03/17 0947  TempSrc: Oral         Complications: No apparent anesthesia complications

## 2017-02-03 NOTE — Anesthesia Preprocedure Evaluation (Signed)
Anesthesia Evaluation  Patient identified by MRN, date of birth, ID band Patient awake    Reviewed: Allergy & Precautions, NPO status , Patient's Chart, lab work & pertinent test results  Airway Mallampati: II  TM Distance: >3 FB Neck ROM: Full    Dental no notable dental hx.    Pulmonary neg pulmonary ROS,    Pulmonary exam normal breath sounds clear to auscultation       Cardiovascular negative cardio ROS Normal cardiovascular exam Rhythm:Regular Rate:Normal     Neuro/Psych negative neurological ROS  negative psych ROS   GI/Hepatic negative GI ROS, Neg liver ROS,   Endo/Other  negative endocrine ROS  Renal/GU negative Renal ROS  negative genitourinary   Musculoskeletal negative musculoskeletal ROS (+)   Abdominal   Peds negative pediatric ROS (+)  Hematology negative hematology ROS (+)   Anesthesia Other Findings   Reproductive/Obstetrics negative OB ROS                             Anesthesia Physical Anesthesia Plan  ASA: II  Anesthesia Plan: MAC   Post-op Pain Management:    Induction: Intravenous  PONV Risk Score and Plan:   Airway Management Planned: Simple Face Mask  Additional Equipment:   Intra-op Plan:   Post-operative Plan:   Informed Consent: I have reviewed the patients History and Physical, chart, labs and discussed the procedure including the risks, benefits and alternatives for the proposed anesthesia with the patient or authorized representative who has indicated his/her understanding and acceptance.   Dental advisory given  Plan Discussed with: CRNA and Surgeon  Anesthesia Plan Comments:         Anesthesia Quick Evaluation

## 2017-02-03 NOTE — H&P (Signed)
Stephanie Ingram HPI: This is a 76 year old female with complaints of painless hematochezia that started on Monday.  Over the next couple of days it worsened and she had more bleeding on Wednesday.  She was sent from her PCP's office to the ER and her HGB on 01/31/2017 was at 13.4 g/dL.  The patient was told to follow up with Dr. Loreta Ingram.  In the past she had a colonoscopy 05/21/2014 with pandiverticulosis and the procedure was performed for routine screening.  No reports of abdominal pain, nausea, vomiting, chest pain, or SOB.  Past Medical History:  Diagnosis Date  . Arthritis   . Chronic back pain    HNP  . GERD (gastroesophageal reflux disease)    occasionally but doesn't take any meds  . GI bleeding   . History of shingles   . Hyperlipidemia    takes Tricor daily no longer takes  . Joint pain   . Osteoporosis   . Pneumonia 10+yrs ago   hx of  . PONV (postoperative nausea and vomiting)   . Short-term memory loss   . Shortness of breath dyspnea    with exertion  . Urinary frequency     Past Surgical History:  Procedure Laterality Date  . COLONOSCOPY    . LUMBAR LAMINECTOMY Right 09/11/2013   Procedure: MICRODISCECTOMY LUMBAR LAMINECTOMY;  Surgeon: Eldred MangesMark C Yates, MD;  [facility];  Service: Orthopedics;  Laterality: Right;  Right L2-3 Microdiscectomy  . LUMBAR LAMINECTOMY/DECOMPRESSION MICRODISCECTOMY N/A 06/29/2015   Procedure: Right L3-4 Microdiscectomy;  Surgeon: Eldred MangesMark C Yates, MD;  [facility];  Service: Orthopedics;  Laterality: N/A;  . TUBAL LIGATION      History reviewed. No pertinent family history.  Social History:  reports that she has never smoked. She has never used smokeless tobacco. She reports that she does not drink alcohol or use drugs.  Allergies: No Known Allergies  Medications:  Scheduled:  Continuous: . sodium chloride    . lactated ringers 1,000 mL (02/03/17 1003)    No results found for this or any previous visit (from the past 24 hour(s)).    No results found.  ROS:  As stated above in the HPI otherwise negative.  Blood pressure (!) 146/72, pulse 76, temperature 98.2 F (36.8 C), resp. rate 16, height 5\' 4"  (1.626 m), weight 74.4 kg (164 lb), SpO2 93 %.    PE: Gen: NAD, Alert and Oriented HEENT:  [place]/AT, EOMI Neck: Supple, no LAD Lungs: CTA Bilaterally CV: RRR without M/G/R ABM: Soft, NTND, +BS Ext: No C/C/E  Assessment/Plan: 1) Hematochezia. 2) Pandiverticulosis.   She most likely has a diverticular bleed, but I will perform a colonoscopy for further evaluation.  Stephanie Ingram D 02/03/2017, 9:57 AM

## 2017-02-06 ENCOUNTER — Encounter (HOSPITAL_COMMUNITY): Payer: Self-pay | Admitting: Gastroenterology

## 2017-02-13 ENCOUNTER — Encounter (HOSPITAL_COMMUNITY): Payer: Self-pay | Admitting: Gastroenterology

## 2017-02-13 NOTE — Addendum Note (Signed)
Addendum  created 02/13/17 0902 by Eilene Ghaziose, Tira Lafferty, MD   Intraprocedure Event edited, Intraprocedure Staff edited

## 2017-04-12 IMAGING — CR DG CHEST 2V
2 series · 2 of 2 positions shown · non-contrast
Comparison: 09/09/2013

CLINICAL DATA: 74-year-old female with a history of preoperative
chest x-ray

EXAM:
CHEST - 2 VIEW

[w chest pa]
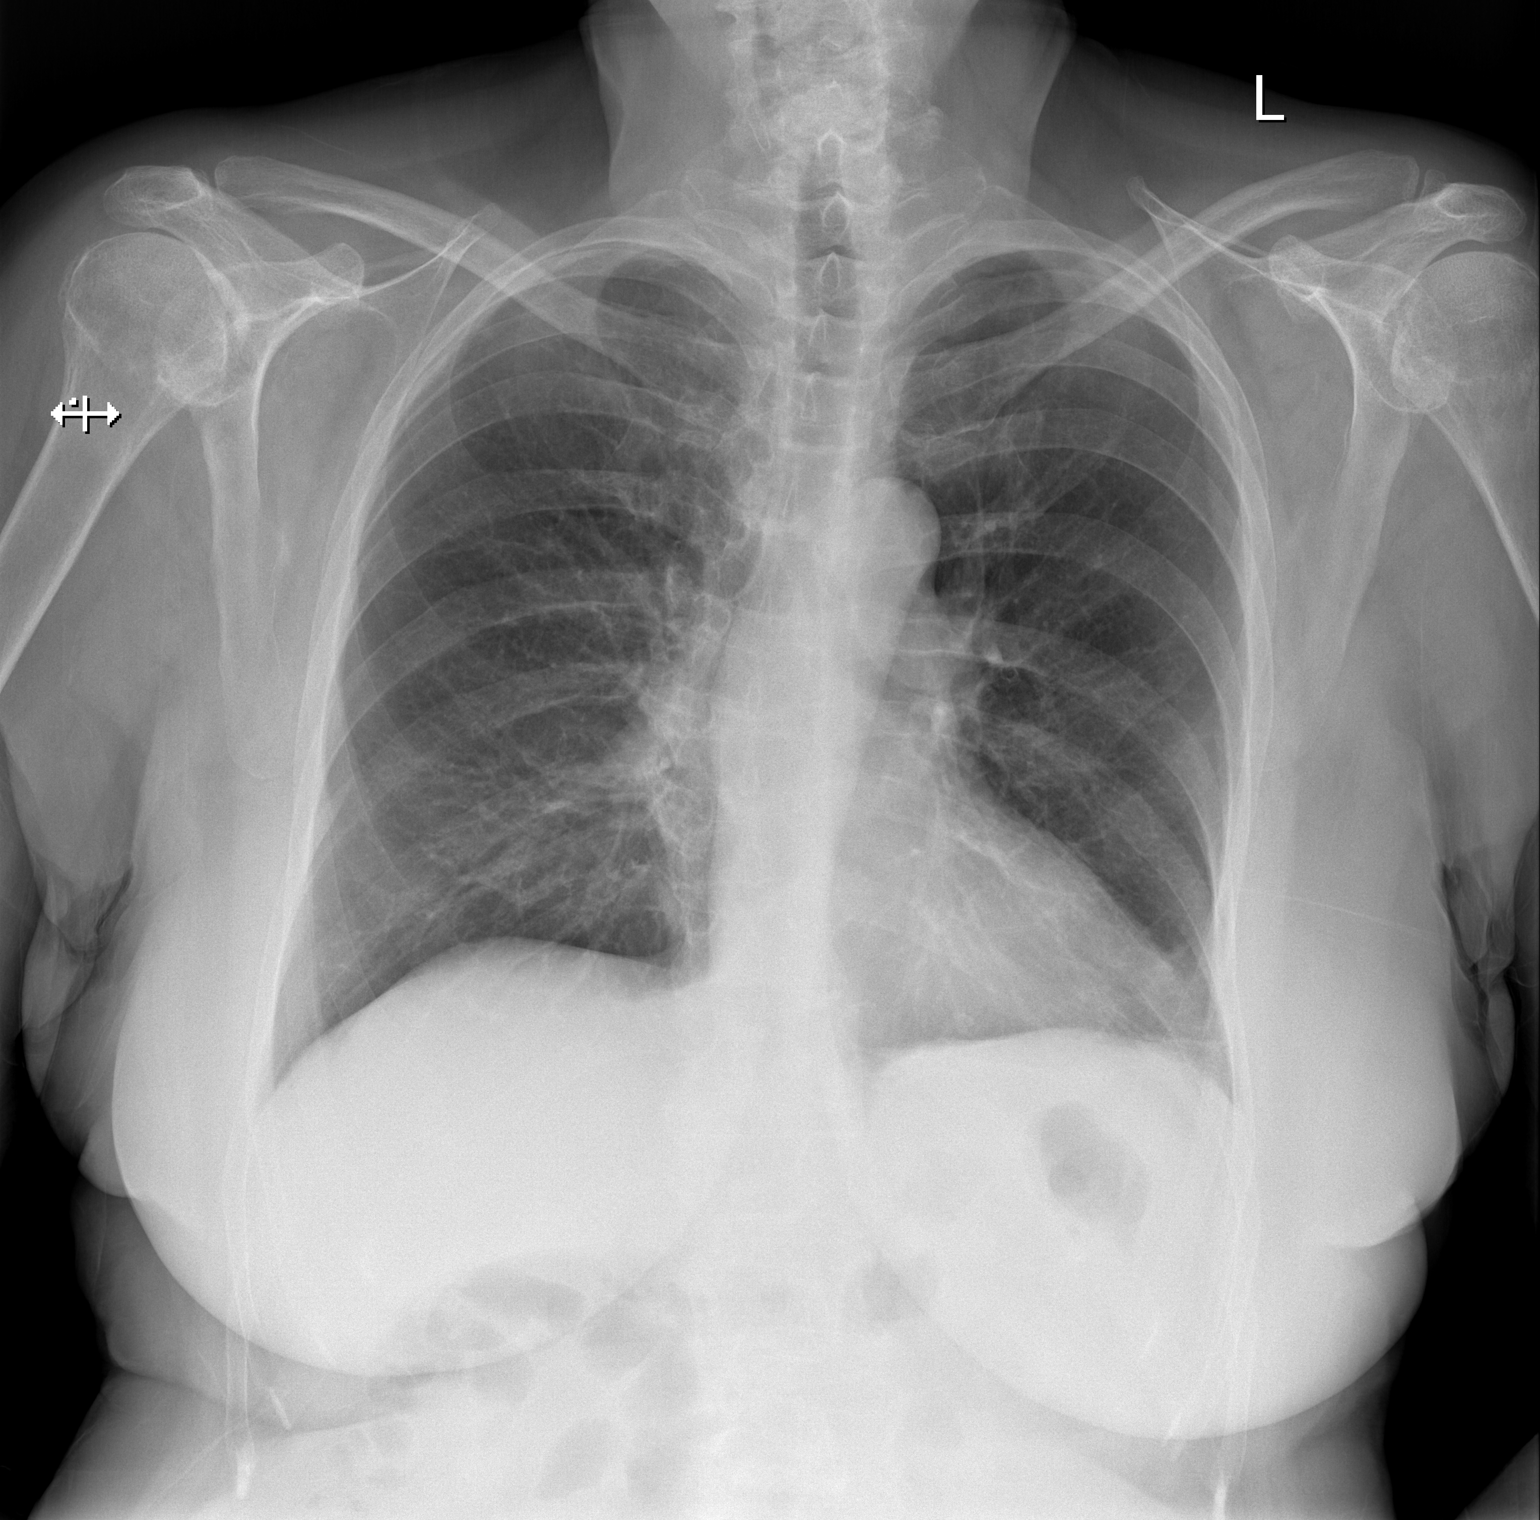

[w chest lat]
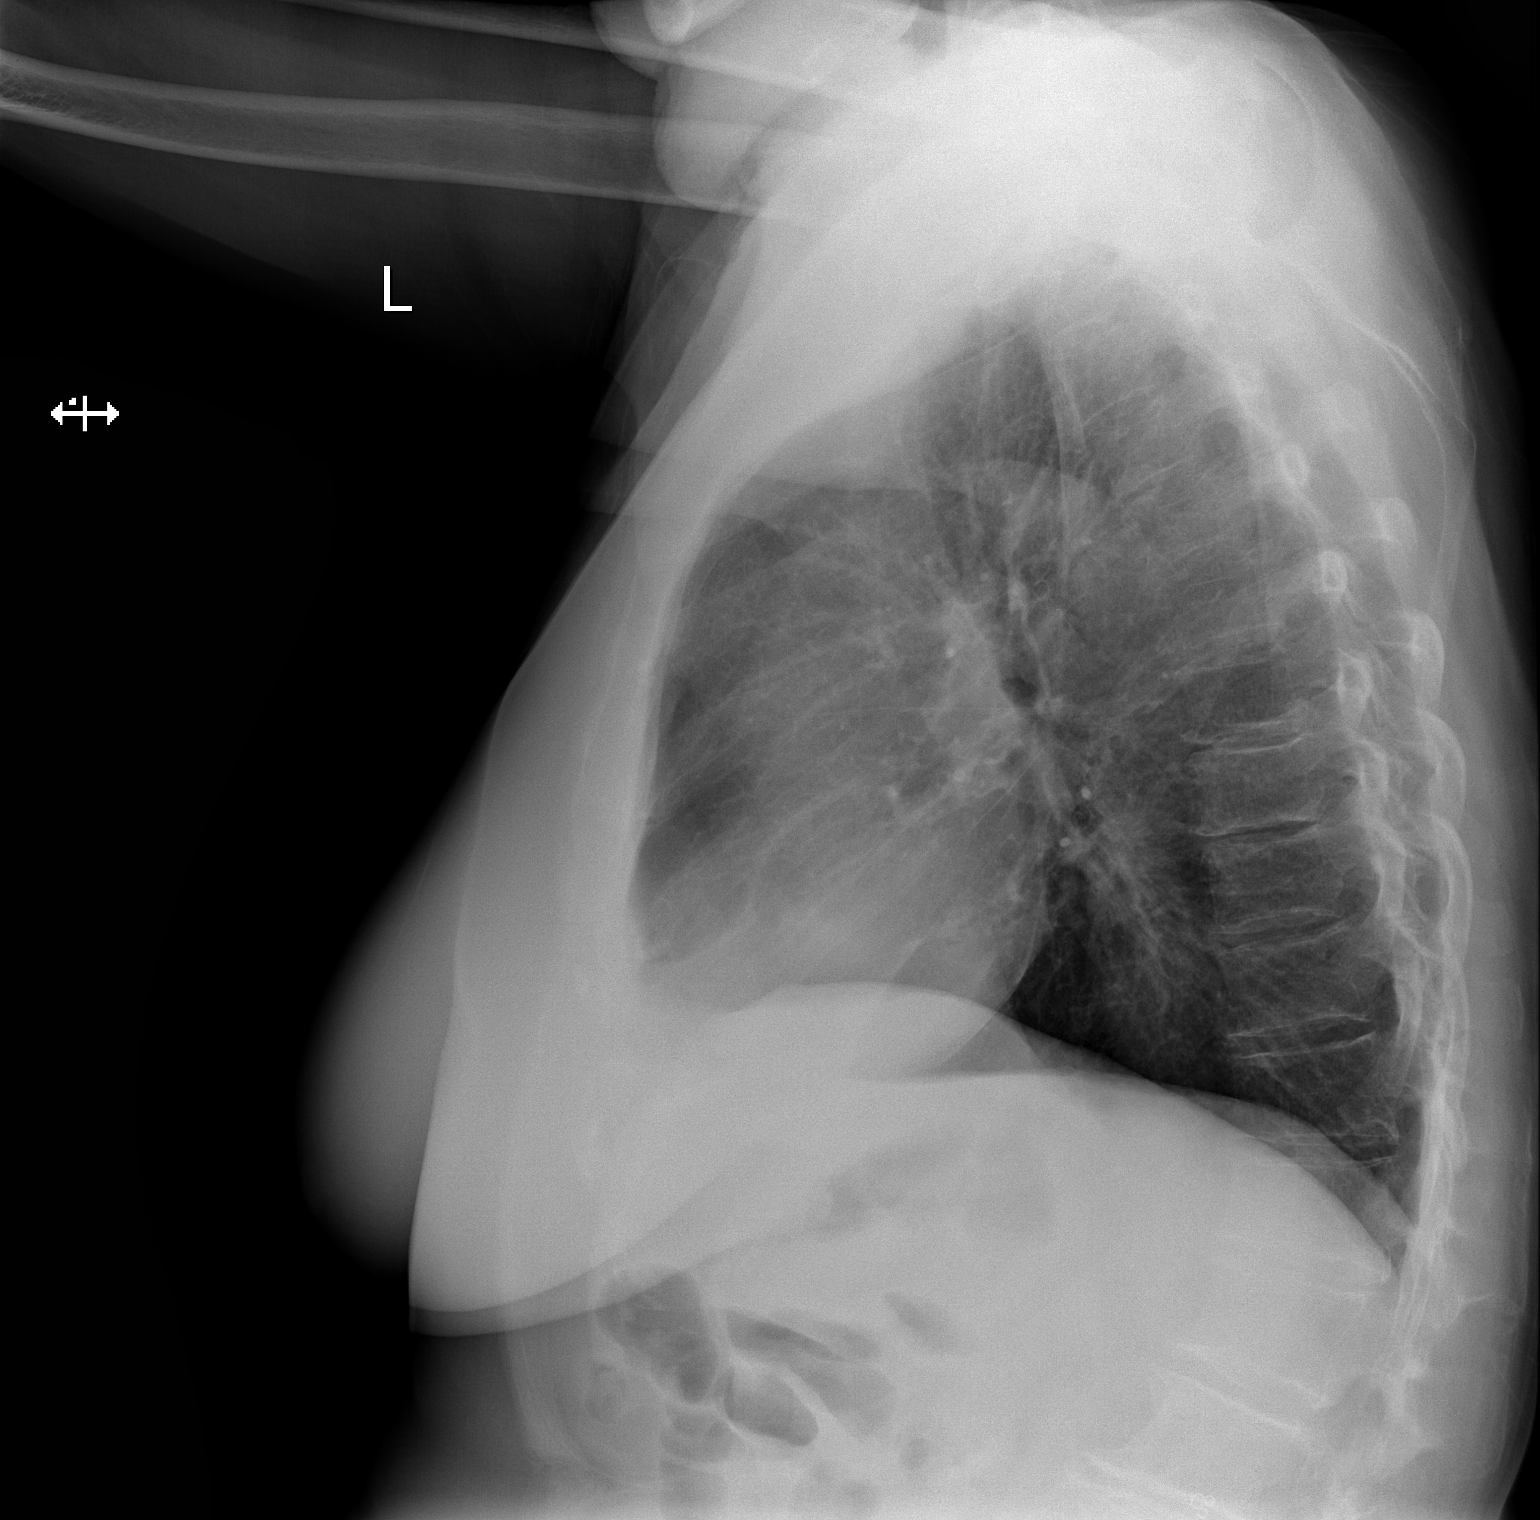

[2 of 2 positions shown; findings below may reference images not displayed]

FINDINGS: Cardiomediastinal silhouette unchanged.

No confluent airspace disease.

Bilateral coarsened interstitial markings, similar to comparison
study.

No pneumothorax or pleural effusion.

Unchanged configuration of thoracic vertebral elements. No displaced
fracture identified. Degenerative changes of the shoulders.

Unremarkable appearance of the upper abdomen.
IMPRESSION: Chronic lung changes with no evidence of superimposed acute
cardiopulmonary disease.

## 2017-04-17 IMAGING — CR DG LUMBAR SPINE 2-3V
3 series · 3 of 3 positions shown · non-contrast
Comparison: 09/11/2013 intraoperative lateral lumbar spine
radiographs.

CLINICAL DATA: L3-4 microdiscectomy.

EXAM:
LUMBAR SPINE - 2-3 VIEW

[xtable lateral (1 of 3)]
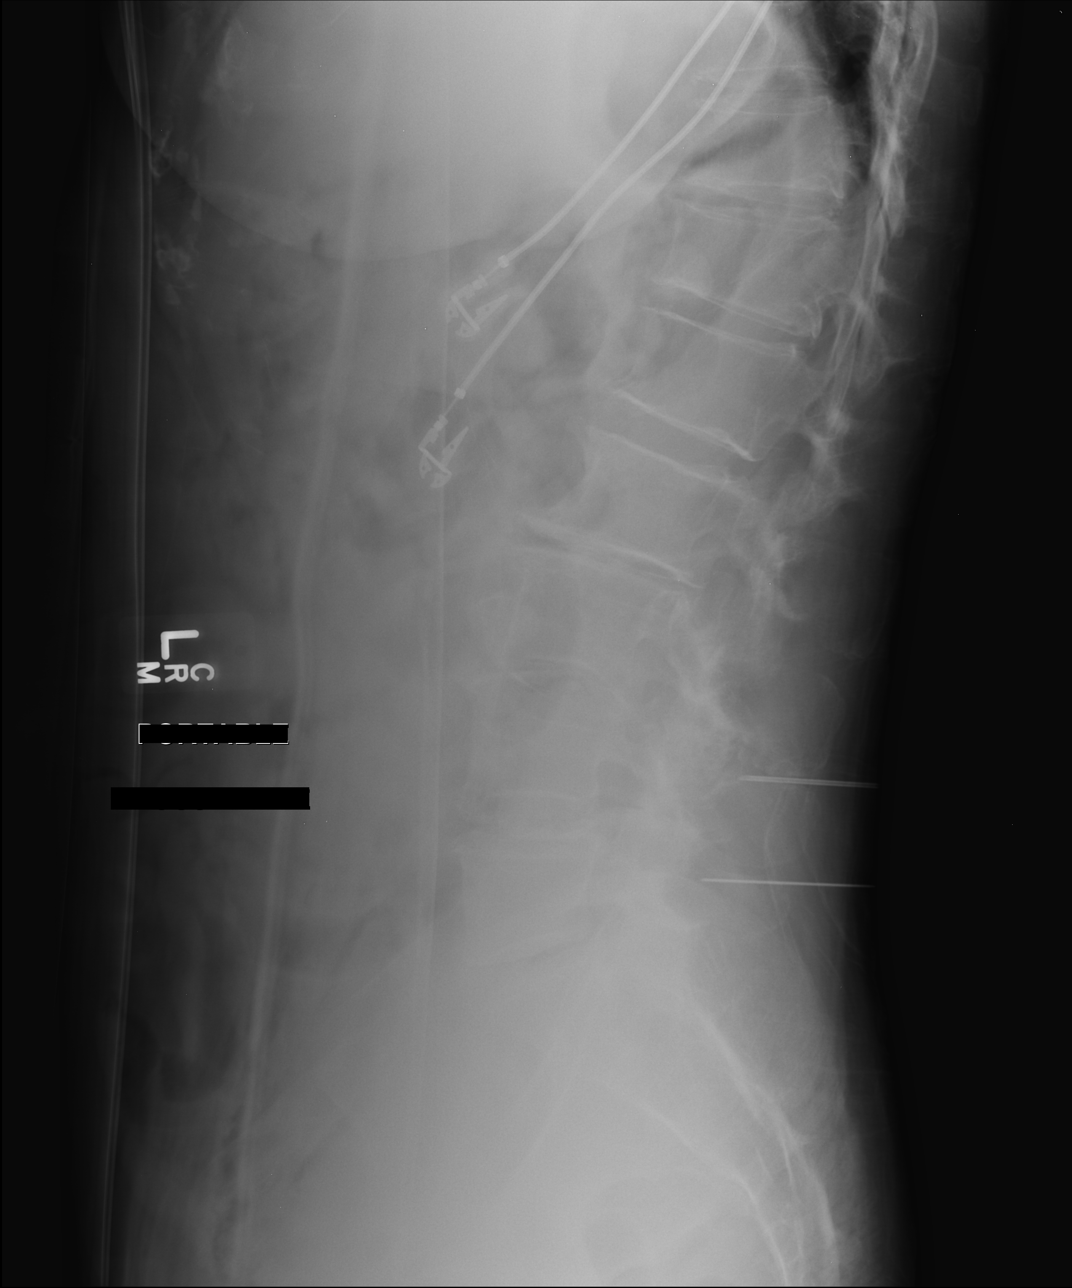

[xtable lateral (2 of 3)]
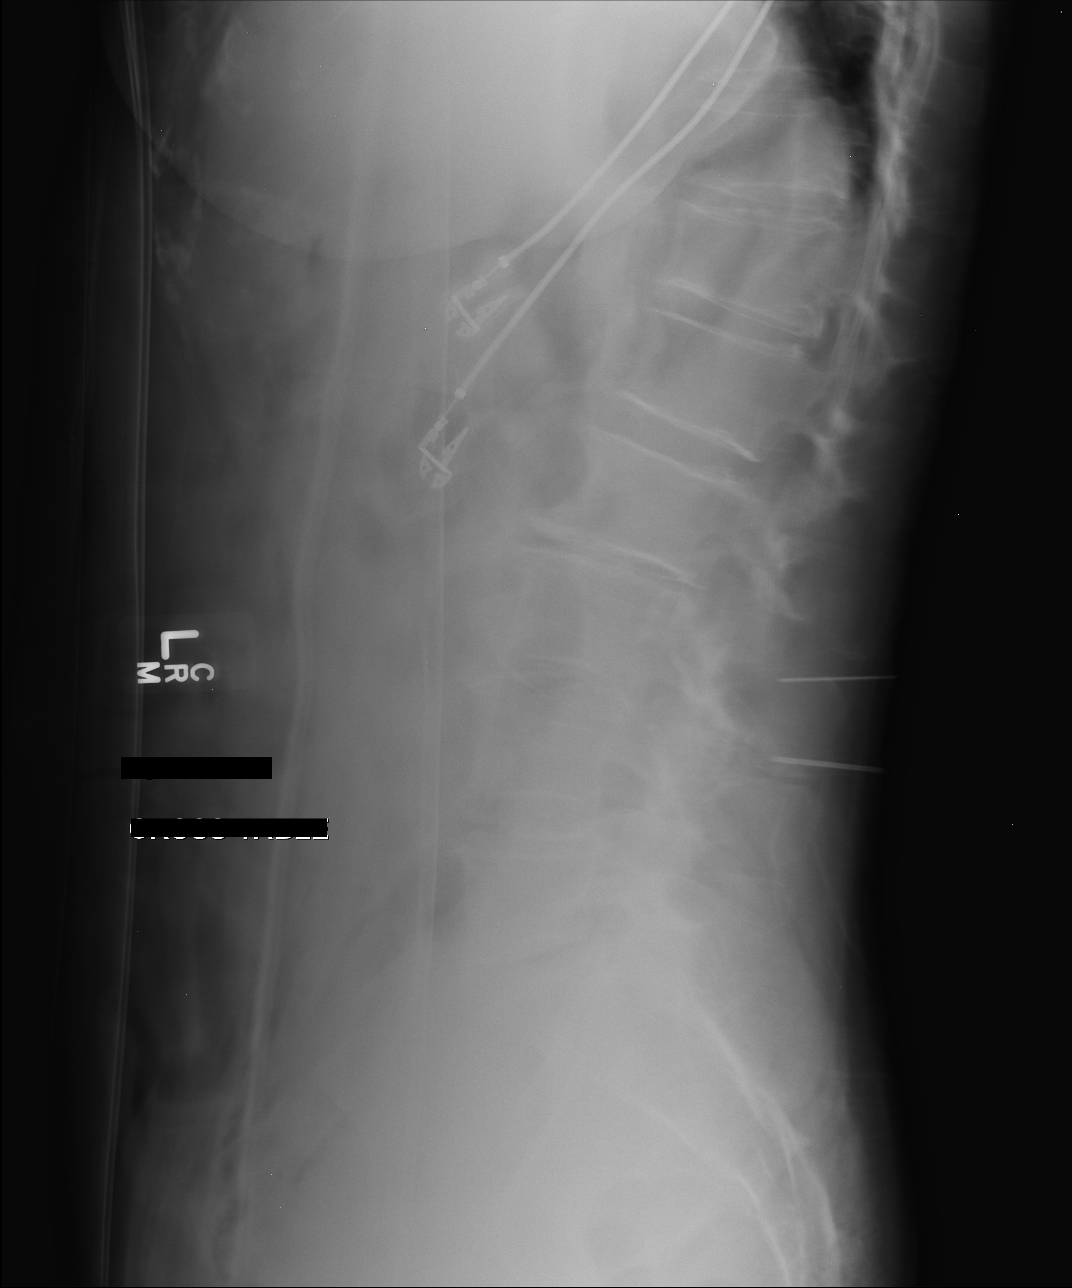

[xtable lateral (3 of 3)]
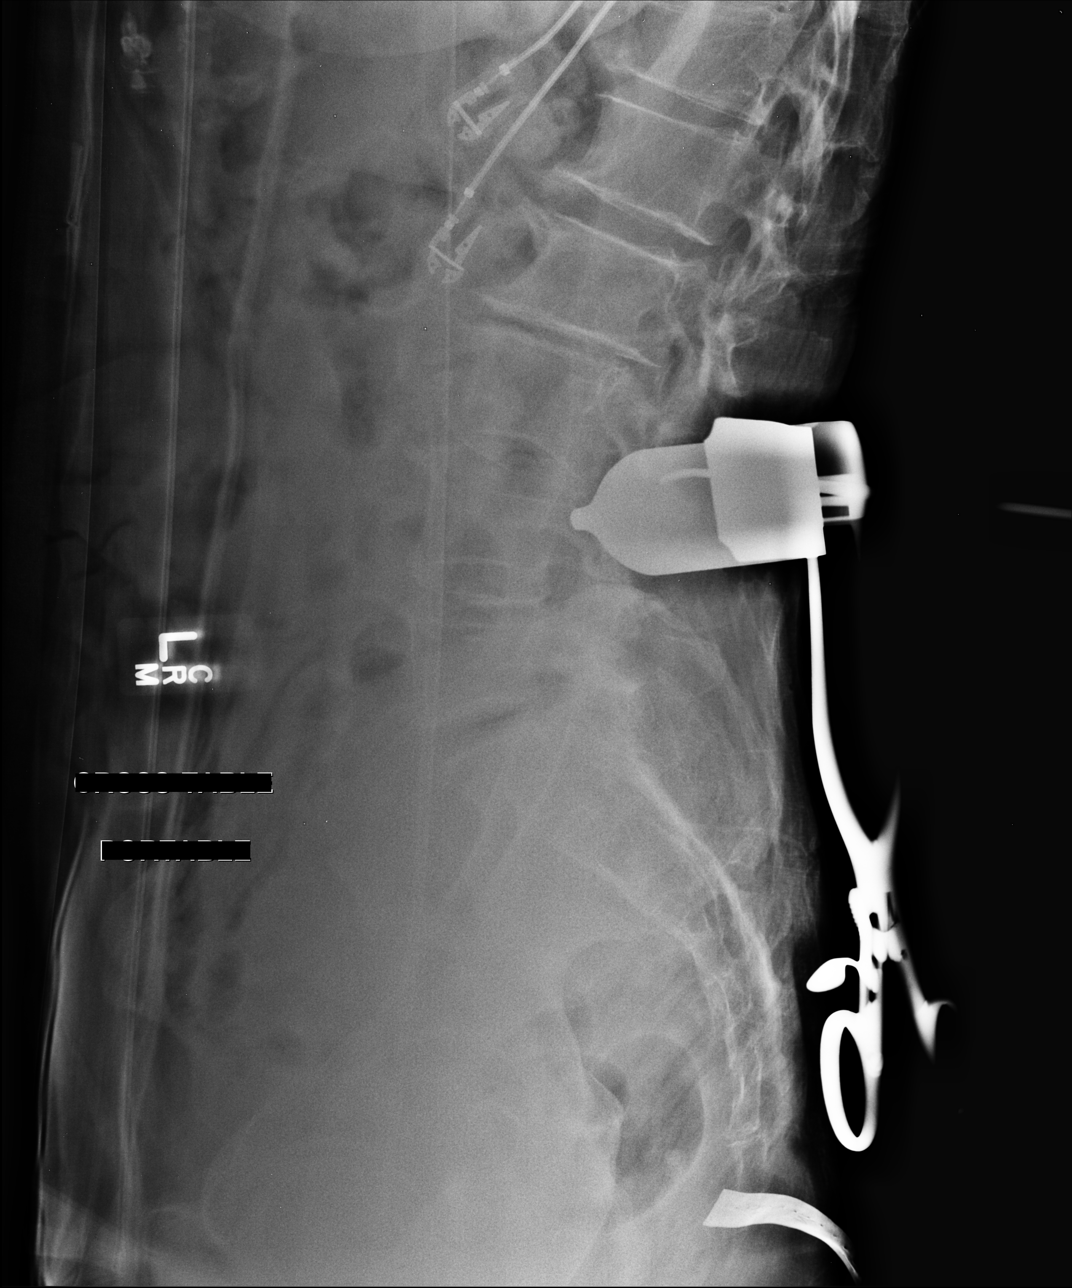

[3 of 3 positions shown; findings below may reference images not displayed]

FINDINGS: Initial cross-table radiograph demonstrates 2 posterior approach
surgical marking devices in the lower back, with the most superior
of which terminating over the upper L4 spinous process in the most
inferior of which terminating over the L5 spinous process.
Subsequent cross-table radiograph demonstrates 2 posterior approach
surgical marking devices, with the most superior of which
terminating over the upper L3 spinous process and the most inferior
of which terminating over the L3-4 interspinous space. Final
cross-table radiograph demonstrates posterior approach surgical
marking device with the tip at the posterior margin of the spinal
canal at the lower L3 vertebral level.
IMPRESSION: Posterior approach surgical marking device positions as described.

## 2017-05-23 ENCOUNTER — Other Ambulatory Visit: Payer: Self-pay | Admitting: Family Medicine

## 2017-05-23 DIAGNOSIS — Z1231 Encounter for screening mammogram for malignant neoplasm of breast: Secondary | ICD-10-CM

## 2017-06-13 ENCOUNTER — Ambulatory Visit
Admission: RE | Admit: 2017-06-13 | Discharge: 2017-06-13 | Disposition: A | Discharge status: Home / Self Care | Payer: Medicare HMO | Source: Ambulatory Visit | Attending: Family Medicine | Admitting: Family Medicine

## 2017-06-13 DIAGNOSIS — Z1231 Encounter for screening mammogram for malignant neoplasm of breast: Secondary | ICD-10-CM

## 2018-06-25 ENCOUNTER — Other Ambulatory Visit: Payer: Self-pay

## 2018-06-25 ENCOUNTER — Inpatient Hospital Stay (HOSPITAL_COMMUNITY)
Admission: EM | Admit: 2018-06-25 | Discharge: 2018-06-30 | DRG: 378 | Disposition: A | Discharge status: Home / Self Care | Payer: Medicare HMO | Attending: Internal Medicine | Admitting: Internal Medicine

## 2018-06-25 ENCOUNTER — Encounter (HOSPITAL_COMMUNITY): Payer: Self-pay

## 2018-06-25 ENCOUNTER — Emergency Department (HOSPITAL_COMMUNITY)
Admission: EM | Admit: 2018-06-25 | Discharge: 2018-06-25 | Disposition: A | Discharge status: Home / Self Care | Payer: Medicare HMO | Source: Home / Self Care

## 2018-06-25 DIAGNOSIS — K222 Esophageal obstruction: Secondary | ICD-10-CM | POA: Diagnosis present

## 2018-06-25 DIAGNOSIS — Z79899 Other long term (current) drug therapy: Secondary | ICD-10-CM

## 2018-06-25 DIAGNOSIS — M810 Age-related osteoporosis without current pathological fracture: Secondary | ICD-10-CM | POA: Diagnosis present

## 2018-06-25 DIAGNOSIS — M549 Dorsalgia, unspecified: Secondary | ICD-10-CM | POA: Diagnosis present

## 2018-06-25 DIAGNOSIS — R531 Weakness: Secondary | ICD-10-CM

## 2018-06-25 DIAGNOSIS — Z7982 Long term (current) use of aspirin: Secondary | ICD-10-CM

## 2018-06-25 DIAGNOSIS — Z5321 Procedure and treatment not carried out due to patient leaving prior to being seen by health care provider: Secondary | ICD-10-CM

## 2018-06-25 DIAGNOSIS — K5731 Diverticulosis of large intestine without perforation or abscess with bleeding: Principal | ICD-10-CM | POA: Diagnosis present

## 2018-06-25 DIAGNOSIS — K921 Melena: Secondary | ICD-10-CM | POA: Diagnosis present

## 2018-06-25 DIAGNOSIS — Z8619 Personal history of other infectious and parasitic diseases: Secondary | ICD-10-CM

## 2018-06-25 DIAGNOSIS — M545 Low back pain, unspecified: Secondary | ICD-10-CM | POA: Diagnosis present

## 2018-06-25 DIAGNOSIS — K625 Hemorrhage of anus and rectum: Secondary | ICD-10-CM | POA: Insufficient documentation

## 2018-06-25 DIAGNOSIS — E785 Hyperlipidemia, unspecified: Secondary | ICD-10-CM | POA: Diagnosis present

## 2018-06-25 DIAGNOSIS — R55 Syncope and collapse: Secondary | ICD-10-CM | POA: Diagnosis present

## 2018-06-25 DIAGNOSIS — K2970 Gastritis, unspecified, without bleeding: Secondary | ICD-10-CM | POA: Diagnosis present

## 2018-06-25 DIAGNOSIS — K922 Gastrointestinal hemorrhage, unspecified: Secondary | ICD-10-CM

## 2018-06-25 DIAGNOSIS — K529 Noninfective gastroenteritis and colitis, unspecified: Secondary | ICD-10-CM | POA: Diagnosis present

## 2018-06-25 DIAGNOSIS — D62 Acute posthemorrhagic anemia: Secondary | ICD-10-CM | POA: Diagnosis not present

## 2018-06-25 DIAGNOSIS — G8929 Other chronic pain: Secondary | ICD-10-CM | POA: Diagnosis present

## 2018-06-25 DIAGNOSIS — K219 Gastro-esophageal reflux disease without esophagitis: Secondary | ICD-10-CM

## 2018-06-25 DIAGNOSIS — Z888 Allergy status to other drugs, medicaments and biological substances status: Secondary | ICD-10-CM

## 2018-06-25 LAB — COMPREHENSIVE METABOLIC PANEL
ALBUMIN: 3.7 g/dL (ref 3.5–5.0)
ALK PHOS: 57 U/L (ref 38–126)
ALT: 19 U/L (ref 0–44)
AST: 20 U/L (ref 15–41)
Anion gap: 9 (ref 5–15)
BUN: 28 mg/dL — AB (ref 8–23)
CALCIUM: 9 mg/dL (ref 8.9–10.3)
CHLORIDE: 103 mmol/L (ref 98–111)
CO2: 26 mmol/L (ref 22–32)
Creatinine, Ser: 0.92 mg/dL (ref 0.44–1.00)
GFR calc Af Amer: >60 mL/min (ref >60)
GFR calc non Af Amer: 59 mL/min — ABNORMAL LOW (ref >60)
GLUCOSE: 127 mg/dL — AB (ref 70–99)
Potassium: 4.8 mmol/L (ref 3.5–5.1)
Sodium: 138 mmol/L (ref 135–145)
Total Bilirubin: 0.2 mg/dL — ABNORMAL LOW (ref 0.3–1.2)
Total Protein: 7.1 g/dL (ref 6.5–8.1)

## 2018-06-25 LAB — CBC
HEMATOCRIT: 43.2 % (ref 36.0–46.0)
Hemoglobin: 13.4 g/dL (ref 12.0–15.0)
MCH: 30.5 pg (ref 26.0–34.0)
MCHC: 31 g/dL (ref 30.0–36.0)
MCV: 98.2 fL (ref 80.0–100.0)
Platelets: 318 10*3/uL (ref 150–400)
RBC: 4.4 MIL/uL (ref 3.87–5.11)
RDW: 13.3 % (ref 11.5–15.5)
WBC: 12.7 10*3/uL — ABNORMAL HIGH (ref 4.0–10.5)
nRBC: 0 % (ref 0.0–0.2)

## 2018-06-25 LAB — PROTIME-INR
INR: 1 (ref 0.8–1.2)
Prothrombin Time: 12.6 seconds (ref 11.4–15.2)

## 2018-06-25 LAB — POC OCCULT BLOOD, ED: Fecal Occult Bld: POSITIVE — AB

## 2018-06-25 MED ORDER — SODIUM CHLORIDE 0.9 % IV BOLUS
1000.0000 mL | Freq: Once | INTRAVENOUS | Status: AC
  Administered 2018-06-25: 1000 mL via INTRAVENOUS

## 2018-06-25 MED ORDER — SODIUM CHLORIDE 0.9 % IV SOLN
8.0000 mg/h | INTRAVENOUS | Status: DC
  Administered 2018-06-26: 8 mg/h via INTRAVENOUS
  Filled 2018-06-25 (×4): qty 80

## 2018-06-25 MED ORDER — SODIUM CHLORIDE 0.9 % IV SOLN
INTRAVENOUS | Status: DC
Start: 2018-06-25 — End: 2018-06-26
  Administered 2018-06-26: via INTRAVENOUS

## 2018-06-25 MED ORDER — PANTOPRAZOLE SODIUM 40 MG IV SOLR
INTRAVENOUS | Status: AC
  Filled 2018-06-25: qty 160

## 2018-06-25 MED ORDER — SODIUM CHLORIDE 0.9 % IV SOLN
80.0000 mg | Freq: Once | INTRAVENOUS | Status: AC
  Administered 2018-06-25: 80 mg via INTRAVENOUS
  Filled 2018-06-25: qty 80

## 2018-06-25 NOTE — ED Notes (Signed)
Pt stating that it is was okay to contact both of her sons regarding her care.  Mardelle Matte [phone] Ricky - [phone]

## 2018-06-25 NOTE — ED Provider Notes (Signed)
[facility] EMERGENCY DEPARTMENT Provider Note   CSN: [id number] Arrival date & time: 06/25/18  2155    History   Chief Complaint Chief Complaint  Patient presents with  . Rectal Bleeding    HPI Stephanie Ingram is a 78 y.o. female.  She is presenting with dark black bowel movements that been have been going on today, 3 or 4.  She says her stools of looked dark actually over the course of last week or 2.  Not associated with any abdominal pain.  She says she does not feel dizzy or lightheaded no chest pain or shortness of breath.  She says she has had rectal bleeding before but it was red blood and they did not find a cause for it.  She is on an aspirin a day, was on nsaids but switched to tylenol for arthritis pain.      The history is provided by the patient.  Rectal Bleeding  Quality:  Black and tarry Amount:  Moderate Duration:  1 day Timing:  Constant Chronicity:  New Context: defecation   Context: not constipation and not rectal pain   Similar prior episodes: no   Relieved by:  None tried Worsened by:  Nothing Ineffective treatments:  None tried Associated symptoms: recent illness (uri/cough)   Associated symptoms: no abdominal pain, no dizziness, no epistaxis, no fever, no hematemesis, no light-headedness, no loss of consciousness and no vomiting   Risk factors: NSAID use   Risk factors: no anticoagulant use     Past Medical History:  Diagnosis Date  . Arthritis   . Chronic back pain    HNP  . GERD (gastroesophageal reflux disease)    occasionally but doesn't take any meds  . GI bleeding   . History of shingles   . Hyperlipidemia    takes Tricor daily no longer takes  . Joint pain   . Osteoporosis   . Pneumonia 10+yrs ago   hx of  . PONV (postoperative nausea and vomiting)   . Short-term memory loss   . Shortness of breath dyspnea    with exertion  . Urinary frequency     Patient Active Problem List   Diagnosis Date Noted  . S/P lumbar discectomy  06/29/2015  . Lower GI bleed 05/07/2015  . Hematochezia 05/07/2015  . Chronic lower back pain 05/07/2015  . HLD (hyperlipidemia) 05/07/2015  . HNP (herniated nucleus pulposus), lumbar 09/11/2013  . UTI (urinary tract infection) 09/11/2013    Past Surgical History:  Procedure Laterality Date  . COLONOSCOPY    . COLONOSCOPY N/A 02/03/2017   Procedure: COLONOSCOPY;  Surgeon: Jeani Hawking, MD;  Location: [facility] ENDOSCOPY;  Service: Endoscopy;  Laterality: N/A;  . LUMBAR LAMINECTOMY Right 09/11/2013   Procedure: MICRODISCECTOMY LUMBAR LAMINECTOMY;  Surgeon: Eldred Manges, MD;  Location: [facility] OR;  Service: Orthopedics;  Laterality: Right;  Right L2-3 Microdiscectomy  . LUMBAR LAMINECTOMY/DECOMPRESSION MICRODISCECTOMY N/A 06/29/2015   Procedure: Right L3-4 Microdiscectomy;  Surgeon: Eldred Manges, MD;  [facility]: [facility] OR;  Service: Orthopedics;  Laterality: N/A;  . TUBAL LIGATION       OB History   No obstetric history on file.      Home Medications    Prior to Admission medications   Medication Sig Start Date End Date Taking? Authorizing Provider  aspirin EC 81 MG tablet Take 81 mg by mouth daily.    [provider]  calcium-vitamin D (OSCAL WITH D) 500-200 MG-UNIT tablet Take 2 tablets by mouth daily.  [provider]  cetirizine (ZYRTEC) 10 MG tablet Take 10 mg by mouth daily as needed for allergies.    [provider]  Cranberry 500 MG TABS Take 1,000 mg by mouth daily.    [provider]  denosumab (PROLIA) 60 MG/ML SOLN injection Inject 60 mg into the skin every 6 (six) months. Administer in upper arm, thigh, or abdomen    [provider]  estradiol (ESTRACE) 0.1 MG/GM vaginal cream Place 1 Applicatorful vaginally 3 (three) times a week.    [provider]  ibuprofen (ADVIL,MOTRIN) 200 MG tablet Take 600 mg by mouth daily.    [provider]  Multiple Vitamin (MULTIVITAMIN WITH MINERALS) TABS tablet Take 1 tablet by mouth daily.     [provider]  vitamin B-12 (CYANOCOBALAMIN) 1000 MCG tablet Take 1,000 mcg by mouth daily.    [provider]    Family History Family History  Problem Relation Age of Onset  . Breast cancer Neg Hx     Social History Social History   Tobacco Use  . Smoking status: Never Smoker  . Smokeless tobacco: Never Used  Substance Use Topics  . Alcohol use: No  . Drug use: No     Allergies   Fosamax [alendronate sodium]   Review of Systems Review of Systems  Constitutional: Negative for fever.  HENT: Negative for nosebleeds and sore throat.   Eyes: Negative for visual disturbance.  Respiratory: Negative for shortness of breath.   Cardiovascular: Negative for chest pain.  Gastrointestinal: Positive for anal bleeding and hematochezia. Negative for abdominal pain, hematemesis and vomiting.  Genitourinary: Negative for dysuria.  Musculoskeletal: Negative for neck pain.  Skin: Negative for rash.  Neurological: Negative for dizziness, loss of consciousness and light-headedness.     Physical Exam Updated Vital Signs BP 118/75   Pulse 80   Temp 98.2 F (36.8 C) (Oral)   Resp 16   Ht 5\' 4"  (1.626 m)   Wt 75.7 kg   SpO2 94%   BMI 28.65 kg/m   Physical Exam Vitals signs and nursing note reviewed.  Constitutional:      General: She is not in acute distress.    Appearance: She is well-developed.  HENT:     Head: Normocephalic and atraumatic.  Eyes:     Conjunctiva/sclera: Conjunctivae normal.  Neck:     Musculoskeletal: Neck supple.  Cardiovascular:     Rate and Rhythm: Normal rate and regular rhythm.     Heart sounds: No murmur.  Pulmonary:     Effort: Pulmonary effort is normal. No respiratory distress.     Breath sounds: Normal breath sounds.  Abdominal:     Palpations: Abdomen is soft.     Tenderness: There is no abdominal tenderness.  Musculoskeletal:        General: No signs of injury.     Right lower leg: No edema.     Left lower  leg: No edema.  Skin:    General: Skin is warm and dry.     Capillary Refill: Capillary refill takes less than 2 seconds.  Neurological:     General: No focal deficit present.     Mental Status: She is alert. Mental status is at baseline.     Sensory: No sensory deficit.     Motor: No weakness.      ED Treatments / Results  Labs (all labs ordered are listed, but only abnormal results are displayed) Labs Reviewed  COMPREHENSIVE METABOLIC PANEL -  Abnormal; Notable for the following components:      Result Value   Glucose, Bld 127 (*)    BUN 28 (*)    Total Bilirubin 0.2 (*)    GFR calc non Af Amer 59 (*)    All other components within normal limits  CBC - Abnormal; Notable for the following components:   WBC 12.7 (*)    All other components within normal limits  CBC - Abnormal; Notable for the following components:   WBC 11.3 (*)    RBC 3.49 (*)    Hemoglobin 10.7 (*)    HCT 34.4 (*)    All other components within normal limits  POC OCCULT BLOOD, ED - Abnormal; Notable for the following components:   Fecal Occult Bld POSITIVE (*)    All other components within normal limits  PROTIME-INR  TYPE AND SCREEN    EKG EKG Interpretation  Date/Time:  Monday June 25 2018 22:19:07 EDT Ventricular Rate:  83 PR Interval:    QRS Duration: 74 QT Interval:  349 QTC Calculation: 410 R Axis:   62 Text Interpretation:  Sinus rhythm Low voltage, precordial leads similar to prior 10/18 Confirmed by Meridee ScoreButler, Mylinh Cragg [phone]) on 06/25/2018 10:27:04 PM   Radiology No results found.  Procedures Procedures (including critical care time)  Medications Ordered in ED Medications  pantoprazole (PROTONIX) 80 mg in sodium chloride 0.9 % 250 mL (0.32 mg/mL) infusion (8 mg/hr Intravenous Rate/Dose Verify 06/26/18 0058)  ondansetron (ZOFRAN) tablet 4 mg ( Oral MAR Hold 06/26/18 1052)    Or  ondansetron (ZOFRAN) injection 4 mg ( Intravenous MAR Hold 06/26/18 1052)  acetaminophen (TYLENOL) tablet  650 mg ( Oral MAR Hold 06/26/18 1052)    Or  acetaminophen (TYLENOL) suppository 650 mg ( Rectal MAR Hold 06/26/18 1052)  0.9 %  sodium chloride infusion ( Intravenous Rate/Dose Verify 06/26/18 0058)  0.9 %  sodium chloride infusion (has no administration in time range)  sodium chloride 0.9 % bolus 1,000 mL (1,000 mLs Intravenous New Bag/Given 06/25/18 2221)  pantoprazole (PROTONIX) 80 mg in sodium chloride 0.9 % 100 mL IVPB ( Intravenous Paused 06/25/18 2352)     Initial Impression / Assessment and Plan / ED Course  I have reviewed the triage vital signs and the nursing notes.  Pertinent labs & imaging results that were available during my care of the patient were reviewed by me and considered in my medical decision making (see chart for details).  Clinical Course as of Jun 25 1121  Mon Jun 25, 2018  2206 Last colonoscopy 11/18 by Dr. Geoffery SpruceHuong-diverticulosis otherwise unremarkable.   [MB]  2227 Rectal exam done with nurse as chaperone.  Patient had maroon stool on the glove.  Guaiac sent.  No masses normal tone.   [MB]  2245 Patient does not drink any alcohol and no NSAIDs.   [MB]  2249 Discussed with Dr. Dionicia Ableraman from GI who recommends that the patient be put on a Protonix bolus and drip and anticipated for upper endoscopy tomorrow.   [MB]  2303 Discussed with Dr. Robb Matarrtiz [facility] hospitalist who accepts the patient for admission.   [MB]    Clinical Course User Index [MB] Terrilee FilesButler, Yzabelle Calles C, MD        Final Clinical Impressions(s) / ED Diagnoses   Final diagnoses:  Acute GI bleeding    ED Discharge Orders    None       Terrilee FilesButler, Kinberly Perris C, MD 06/26/18 1125

## 2018-06-25 NOTE — ED Notes (Signed)
Pt states that she is leaving due to wait time.  

## 2018-06-25 NOTE — H&P (Signed)
History and Physical    Stephanie Ingram:[id number] DOB: August 02, 1940 DOA: 06/25/2018  PCP: Barbie Banner, MD   Patient coming from: Home.  I have personally briefly reviewed patient's old medical records in [facility]  Chief Complaint: Rectal bleeding.  HPI: Stephanie Ingram is a 78 y.o. female with medical history significant of Osteoarthritis, chronic back pain, GERD, history of lower GI bleed, shingles, hyperlipidemia, osteoporosis, pneumonia, short-term memory loss, urinary frequency who is coming to the emergency department after having rectal bleeding followed by lightheadedness and a syncopal episode while in the toilet today.  She states that initially the bleeding was maroon in color and now is dark red.  She also states that her stools lately have been looking darker than usual.  She denies abdominal pain, nausea, emesis, constipation, dysuria or hematuria.  Chest pain, dyspnea, palpitations, diaphoresis, PND, orthopnea or current pitting edema lower extremities.  Denies fever, chills, sore throat, rhinorrhea, wheezing or hemoptysis.  Denies polyuria, polydipsia, polyphagia or blurred vision.  ED Course: Initial vital signs temperature 98.4 F, pulse 95, respirations 18, blood pressure 115/82 mmHg and O2 sat 100% on room air.  She was started on NS infusion, but otherwise no medications were given in the emergency department.  White count on CBC was 12.7, hemoglobin 13.4 g/dL and platelets 811.  Fecko occult blood was positive.  PT was 12.6 and INR 1.0.  CMP shows a glucose of 127 and a BUN of 28 mg/dL.  All other values are within normal limits.  Review of Systems: As per HPI otherwise 10 point review of systems negative.   Past Medical History:  Diagnosis Date  . Arthritis   . Chronic back pain    HNP  . GERD (gastroesophageal reflux disease)    occasionally but doesn't take any meds  . GI bleeding   . History of shingles   . Hyperlipidemia    takes Tricor daily  no longer takes  . Joint pain   . Osteoporosis   . Pneumonia 10+yrs ago   hx of  . PONV (postoperative nausea and vomiting)   . Short-term memory loss   . Shortness of breath dyspnea    with exertion  . Urinary frequency     Past Surgical History:  Procedure Laterality Date  . COLONOSCOPY    . COLONOSCOPY N/A 02/03/2017   Procedure: COLONOSCOPY;  Surgeon: Jeani Hawking, MD;  Location: [facility];  Service: Endoscopy;  Laterality: N/A;  . LUMBAR LAMINECTOMY Right 09/11/2013   Procedure: MICRODISCECTOMY LUMBAR LAMINECTOMY;  Surgeon: Eldred Manges, MD;  Location: [facility] OR;  Service: Orthopedics;  Laterality: Right;  Right L2-3 Microdiscectomy  . LUMBAR LAMINECTOMY/DECOMPRESSION MICRODISCECTOMY N/A 06/29/2015   Procedure: Right L3-4 Microdiscectomy;  Surgeon: Eldred Manges, MD;  Location: [facility] OR;  Service: Orthopedics;  Laterality: N/A;  . TUBAL LIGATION       reports that she has never smoked. She has never used smokeless tobacco. She reports that she does not drink alcohol or use drugs.  Allergies  Allergen Reactions  . Nitrofurantoin Shortness Of Breath  . Denosumab Swelling and Hives  . Fosamax [Alendronate Sodium] Swelling  . Sulfamethoxazole-Trimethoprim Other (See Comments)    Unspecified: patient does not remember this allergy-this was entered via [facility] records    Family History  Problem Relation Age of Onset  . Breast cancer Neg Hx    Prior to Admission medications   Medication Sig Start Date End Date Taking? Authorizing Provider  acetaminophen (  TYLENOL) 500 MG tablet Take 500 mg by mouth every 6 (six) hours as needed for mild pain or moderate pain.   Yes [provider]  aspirin EC 81 MG tablet Take 81 mg by mouth daily.   Yes [provider]  calcium-vitamin D (OSCAL WITH D) 500-200 MG-UNIT tablet Take 2 tablets by mouth daily.   Yes [provider]  cetirizine (ZYRTEC) 10 MG tablet Take 10 mg by mouth daily as needed for allergies.   Yes  [provider]  estradiol (ESTRACE) 0.1 MG/GM vaginal cream Place 1 Applicatorful vaginally 3 (three) times a week.   Yes [provider]  guaiFENesin (ROBITUSSIN) 100 MG/5ML SOLN Take 5 mLs by mouth every 4 (four) hours as needed for cough or to loosen phlegm.   Yes [provider]  meloxicam (MOBIC) 15 MG tablet Take 15 mg by mouth daily as needed for pain.  03/13/18  Yes [provider]  Multiple Vitamin (MULTIVITAMIN WITH MINERALS) TABS tablet Take 1 tablet by mouth daily.   Yes [provider]    Physical Exam: Vitals:   06/25/18 2226 06/25/18 2247 06/25/18 2330 06/25/18 2341  BP: 129/85   126/80  Pulse:   86 (!) 101  Resp: 18  (!) 28 17  Temp:  98.4 F (36.9 C)  98.3 F (36.8 C)  TempSrc:  Oral  Oral  SpO2:   97% 98%  Weight:    75.7 kg  Height:    5\' 4"  (1.626 m)    Constitutional: NAD, calm, comfortable Eyes: PERRL, lids and conjunctivae normal ENMT: Mucous membranes are moist. Posterior pharynx clear of any exudate or lesions. Neck: normal, supple, no masses, no thyromegaly Respiratory: clear to auscultation bilaterally, no wheezing, no crackles. Normal respiratory effort. No accessory muscle use.  Cardiovascular: Regular rate and rhythm, no murmurs / rubs / gallops. No extremity edema. 2+ pedal pulses. No carotid bruits.  Abdomen: Soft, no tenderness, no masses palpated. No hepatosplenomegaly. Bowel sounds positive.  Musculoskeletal: no clubbing / cyanosis.  Good ROM, no contractures. Normal muscle tone.  Skin: no rashes, lesions, ulcers on limited dermatological examination. Neurologic: CN 2-12 grossly intact. Sensation intact, DTR normal. Strength 5/5 in all 4.  Psychiatric: Normal judgment and insight. Alert and oriented x 3. Normal mood.   Labs on Admission: I have personally reviewed following labs and imaging studies  CBC: Recent Labs  Lab 06/25/18 2220  WBC 12.7*  HGB 13.4  HCT 43.2  MCV 98.2  PLT 318   Basic  Metabolic Panel: Recent Labs  Lab 06/25/18 2212  NA 138  K 4.8  CL 103  CO2 26  GLUCOSE 127*  BUN 28*  CREATININE 0.92  CALCIUM 9.0   GFR: Estimated Creatinine Clearance: 49.4 mL/min (by C-G formula based on SCr of 0.92 mg/dL). Liver Function Tests: Recent Labs  Lab 06/25/18 2212  AST 20  ALT 19  ALKPHOS 57  BILITOT 0.2*  PROT 7.1  ALBUMIN 3.7   No results for input(s): LIPASE, AMYLASE in the last 168 hours. No results for input(s): AMMONIA in the last 168 hours. Coagulation Profile: Recent Labs  Lab 06/25/18 2212  INR 1.0   Cardiac Enzymes: No results for input(s): CKTOTAL, CKMB, CKMBINDEX, TROPONINI in the last 168 hours. BNP (last 3 results) No results for input(s): PROBNP in the last 8760 hours. HbA1C: No results for input(s): HGBA1C in the last 72 hours. CBG: No results for input(s): GLUCAP in the last 168 hours. Lipid Profile: No  results for input(s): CHOL, HDL, LDLCALC, TRIG, CHOLHDL, LDLDIRECT in the last 72 hours. Thyroid Function Tests: No results for input(s): TSH, T4TOTAL, FREET4, T3FREE, THYROIDAB in the last 72 hours. Anemia Panel: No results for input(s): VITAMINB12, FOLATE, FERRITIN, TIBC, IRON, RETICCTPCT in the last 72 hours. Urine analysis:    Component Value Date/Time   COLORURINE YELLOW 09/09/2013 1444   APPEARANCEUR CLOUDY (A) 09/09/2013 1444   LABSPEC 1.018 09/09/2013 1444   PHURINE 5.5 09/09/2013 1444   GLUCOSEU NEGATIVE 09/09/2013 1444   HGBUR MODERATE (A) 09/09/2013 1444   BILIRUBINUR NEGATIVE 09/09/2013 1444   KETONESUR NEGATIVE 09/09/2013 1444   PROTEINUR NEGATIVE 09/09/2013 1444   UROBILINOGEN 0.2 09/09/2013 1444   NITRITE POSITIVE (A) 09/09/2013 1444   LEUKOCYTESUR LARGE (A) 09/09/2013 1444    Radiological Exams on Admission: No results found.  EKG: Independently reviewed.  Vent. rate 83 BPM PR interval * ms QRS duration 74 ms QT/QTc 349/410 ms P-R-T axes 67 62 59 Sinus rhythm Low voltage, precordial leads   Assessment/Plan Principal Problem:   Melena   Hematochezia  Observation/telemetry. Keep n.p.o. Continue Protonix infusion. Monitor H&H. Transfuse as needed. GI will evaluate in the morning.  Active Problems:   Chronic lower back pain Analgesics as needed.    HLD (hyperlipidemia) Currently not on medical therapy.    GERD (gastroesophageal reflux disease) On Protonix infusion.    DVT prophylaxis: SCDs. Code Status: Full code. Family Communication: Disposition Plan: Observation for H&H monitoring and GI evaluation. Consults called: Routine gastroenterology consult. Admission status: Observation/telemetry.   Bobette Mo MD [facility]  06/25/2018, 11:59 PM   This document was prepared using Dragon voice recognition software and may contain some unintended transcription errors.

## 2018-06-25 NOTE — ED Triage Notes (Signed)
Pt brought to ED via [facility] for rectal bleeding started today. Pt also states she had a syncope episode while on the toilet today. Pt states rectal bleeding started as dark but now lighter. Pt states her stool has been darker than normal.

## 2018-06-26 ENCOUNTER — Other Ambulatory Visit: Payer: Self-pay

## 2018-06-26 ENCOUNTER — Observation Stay (HOSPITAL_COMMUNITY): Payer: Medicare HMO | Admitting: Anesthesiology

## 2018-06-26 ENCOUNTER — Encounter (HOSPITAL_COMMUNITY)
Admission: EM | Disposition: A | Discharge status: Home / Self Care | Payer: Self-pay | Source: Home / Self Care | Attending: Internal Medicine

## 2018-06-26 ENCOUNTER — Encounter (HOSPITAL_COMMUNITY): Payer: Self-pay | Admitting: Internal Medicine

## 2018-06-26 DIAGNOSIS — K222 Esophageal obstruction: Secondary | ICD-10-CM | POA: Diagnosis not present

## 2018-06-26 DIAGNOSIS — K922 Gastrointestinal hemorrhage, unspecified: Secondary | ICD-10-CM

## 2018-06-26 DIAGNOSIS — K219 Gastro-esophageal reflux disease without esophagitis: Secondary | ICD-10-CM | POA: Diagnosis not present

## 2018-06-26 DIAGNOSIS — K921 Melena: Secondary | ICD-10-CM | POA: Diagnosis not present

## 2018-06-26 HISTORY — PX: ESOPHAGOGASTRODUODENOSCOPY (EGD) WITH PROPOFOL: SHX5813

## 2018-06-26 LAB — TYPE AND SCREEN
ABO/RH(D): O POS
ANTIBODY SCREEN: NEGATIVE

## 2018-06-26 LAB — CBC
HCT: 34.4 % — ABNORMAL LOW (ref 36.0–46.0)
Hemoglobin: 10.7 g/dL — ABNORMAL LOW (ref 12.0–15.0)
MCH: 30.7 pg (ref 26.0–34.0)
MCHC: 31.1 g/dL (ref 30.0–36.0)
MCV: 98.6 fL (ref 80.0–100.0)
Platelets: 270 10*3/uL (ref 150–400)
RBC: 3.49 MIL/uL — ABNORMAL LOW (ref 3.87–5.11)
RDW: 13.4 % (ref 11.5–15.5)
WBC: 11.3 10*3/uL — AB (ref 4.0–10.5)
nRBC: 0 % (ref 0.0–0.2)

## 2018-06-26 SURGERY — ESOPHAGOGASTRODUODENOSCOPY (EGD) WITH PROPOFOL
Anesthesia: Monitor Anesthesia Care

## 2018-06-26 MED ORDER — SODIUM CHLORIDE 0.9 % IV SOLN
INTRAVENOUS | Status: DC
  Administered 2018-06-26: 15:00:00 via INTRAVENOUS

## 2018-06-26 MED ORDER — LIDOCAINE VISCOUS HCL 2 % MT SOLN: 5.0000 mL | Freq: Once | OROMUCOSAL | Status: DC

## 2018-06-26 MED ORDER — LACTATED RINGERS IV SOLN
INTRAVENOUS | Status: DC
  Administered 2018-06-26: 12:00:00 via INTRAVENOUS

## 2018-06-26 MED ORDER — HYDROCODONE-ACETAMINOPHEN 7.5-325 MG PO TABS: 1.0000 | ORAL_TABLET | Freq: Once | ORAL | Status: DC | PRN

## 2018-06-26 MED ORDER — ONDANSETRON HCL 4 MG/2ML IJ SOLN: 4.0000 mg | Freq: Four times a day (QID) | INTRAMUSCULAR | Status: DC | PRN

## 2018-06-26 MED ORDER — ACETAMINOPHEN 650 MG RE SUPP: 650.0000 mg | Freq: Four times a day (QID) | RECTAL | Status: DC | PRN

## 2018-06-26 MED ORDER — SODIUM CHLORIDE 0.9 % IV SOLN: INTRAVENOUS | Status: DC

## 2018-06-26 MED ORDER — MEPERIDINE HCL 100 MG/ML IJ SOLN: 6.2500 mg | INTRAMUSCULAR | Status: DC | PRN

## 2018-06-26 MED ORDER — PROMETHAZINE HCL 25 MG/ML IJ SOLN: 6.2500 mg | INTRAMUSCULAR | Status: DC | PRN

## 2018-06-26 MED ORDER — PROPOFOL 10 MG/ML IV BOLUS
INTRAVENOUS | Status: DC | PRN
  Administered 2018-06-26: 15 mg via INTRAVENOUS
  Administered 2018-06-26: 30 mg via INTRAVENOUS

## 2018-06-26 MED ORDER — HYDROMORPHONE HCL 1 MG/ML IJ SOLN: 0.2500 mg | INTRAMUSCULAR | Status: DC | PRN

## 2018-06-26 MED ORDER — LIDOCAINE VISCOUS HCL 2 % MT SOLN
OROMUCOSAL | Status: AC
  Filled 2018-06-26: qty 15

## 2018-06-26 MED ORDER — PROPOFOL 10 MG/ML IV BOLUS
INTRAVENOUS | Status: AC
  Filled 2018-06-26: qty 40

## 2018-06-26 MED ORDER — STERILE WATER FOR IRRIGATION IR SOLN
Status: DC | PRN
  Administered 2018-06-26: 100 mL

## 2018-06-26 MED ORDER — SODIUM CHLORIDE 0.9 % IV SOLN
INTRAVENOUS | Status: DC
  Administered 2018-06-26: 01:00:00 via INTRAVENOUS

## 2018-06-26 MED ORDER — ACETAMINOPHEN 325 MG PO TABS: 650.0000 mg | ORAL_TABLET | Freq: Four times a day (QID) | ORAL | Status: DC | PRN

## 2018-06-26 MED ORDER — LIDOCAINE VISCOUS HCL 2 % MT SOLN
OROMUCOSAL | Status: DC | PRN
  Administered 2018-06-26: 5 mL via OROMUCOSAL

## 2018-06-26 MED ORDER — PROPOFOL 500 MG/50ML IV EMUL
INTRAVENOUS | Status: DC | PRN
  Administered 2018-06-26: 100 ug/kg/min via INTRAVENOUS

## 2018-06-26 MED ORDER — ONDANSETRON HCL 4 MG PO TABS: 4.0000 mg | ORAL_TABLET | Freq: Four times a day (QID) | ORAL | Status: DC | PRN

## 2018-06-26 MED ORDER — PANTOPRAZOLE SODIUM 40 MG PO TBEC
40.0000 mg | DELAYED_RELEASE_TABLET | Freq: Every day | ORAL | Status: DC
  Administered 2018-06-27 – 2018-06-30 (×3): 40 mg via ORAL
  Filled 2018-06-26 (×3): qty 1

## 2018-06-26 MED ORDER — LACTATED RINGERS IV SOLN: INTRAVENOUS | Status: DC

## 2018-06-26 NOTE — Anesthesia Preprocedure Evaluation (Signed)
Anesthesia Evaluation    Airway Mallampati: II       Dental  (+) Edentulous Upper   Pulmonary shortness of breath, with exertion and at rest, pneumonia,     + decreased breath sounds      Cardiovascular  Rhythm:regular     Neuro/Psych    GI/Hepatic GERD  ,  Endo/Other    Renal/GU      Musculoskeletal   Abdominal   Peds  Hematology   Anesthesia Other Findings Onset of rectal bleed yesterday  Hgb 13 at 2230 yesterday, 2/23 H/O syncopal episode with GI bleed two years ago, dx diverticulitis  Reproductive/Obstetrics                             Anesthesia Physical Anesthesia Plan  ASA: III  Anesthesia Plan: MAC   Post-op Pain Management:    Induction:   PONV Risk Score and Plan:   Airway Management Planned:   Additional Equipment:   Intra-op Plan:   Post-operative Plan:   Informed Consent: I have reviewed the patients History and Physical, chart, labs and discussed the procedure including the risks, benefits and alternatives for the proposed anesthesia with the patient or authorized representative who has indicated his/her understanding and acceptance.       Plan Discussed with: Anesthesiologist  Anesthesia Plan Comments:         Anesthesia Quick Evaluation

## 2018-06-26 NOTE — Anesthesia Procedure Notes (Signed)
Procedure Name: MAC Date/Time: 06/26/2018 1:01 PM Performed by: Vista Deck, CRNA Pre-anesthesia Checklist: Patient identified, Emergency Drugs available, Suction available, Timeout performed and Patient being monitored Patient Re-evaluated:Patient Re-evaluated prior to induction Oxygen Delivery Method: Nasal Cannula

## 2018-06-26 NOTE — Plan of Care (Signed)
CALLED PT'S DAUGHTER.  DISCUSSED RESULTS AND RECOMMENDATIONS. PT'S DAUGHTER VOICED HER UNDERSTANDING.

## 2018-06-26 NOTE — Transfer of Care (Signed)
Immediate Anesthesia Transfer of Care Note  Patient: Stephanie Ingram  Procedure(s) Performed: ESOPHAGOGASTRODUODENOSCOPY (EGD) WITH PROPOFOL (N/A )  Patient Location: PACU  Anesthesia Type:MAC  Level of Consciousness: awake, alert  and patient cooperative  Airway & Oxygen Therapy: Patient Spontanous Breathing  Post-op Assessment: Report given to RN and Post -op Vital signs reviewed and stable  Post vital signs: Reviewed and stable  Last Vitals:  Vitals Value Taken Time  BP 100/54 06/26/2018  1:24 PM  Temp    Pulse 82 06/26/2018  1:25 PM  Resp 21 06/26/2018  1:25 PM  SpO2 97 % 06/26/2018  1:25 PM  Vitals shown include unvalidated device data.  Last Pain:  Vitals:   06/26/18 1318  TempSrc:   PainSc: 0-No pain      Patients Stated Pain Goal: 7 (31/59/45 8592)  Complications: No apparent anesthesia complications

## 2018-06-26 NOTE — Consult Note (Signed)
Referring Provider: Dr. Ortiz Primary Care Physician:  Wilson, Fred H, MD Primary Gastroenterologist:  Dr. Hung   Date of Admission: 06/25/18 Date of Consultation: 06/26/18  Reason for Consultation:  GI bleed   HPI:  Stephanie Ingram is a 77 y.o. year old female presenting to the ED yesterday with rectal bleeding and syncopal episode. Prior history of likely diverticular bleed with hospitalization in Feb 2017. Last colonoscopy Nov 2018 by Dr. Hung with diverticulosis in entire colon. Admitting Hgb 12.7, now 10.7.   Reports onset of overt bleeding yesterday around noon. Noted to be very dark, almost black, and burgundy. Several overnight. Some dark blood with clots this morning. No abdominal pain. Felt weak this morning when getting up. No vomiting. Felt nauseated. Takes Mobic every once in awhile for joint pain. 81 mg aspirin daily as well. Occasional reflux and will take Tums, about 3-4 times per month. Increasing reflux recently. No solid food dysphagia. Pill dysphagia with larger pills. Good appetite. No unintentional weight loss. +chills in the ED but afebrile. No prior EGD. No OTC NSAIDs.  Spoke with nursing staff today who stated stool was black with some red mixed in this morning.     Past Medical History:  Diagnosis Date  . Arthritis   . Chronic back pain    HNP  . GERD (gastroesophageal reflux disease)    occasionally but doesn't take any meds  . GI bleeding   . History of shingles   . Hyperlipidemia    takes Tricor daily no longer takes  . Joint pain   . Osteoporosis   . Pneumonia 10+yrs ago   hx of  . PONV (postoperative nausea and vomiting)   . Short-term memory loss   . Shortness of breath dyspnea    with exertion  . Urinary frequency     Past Surgical History:  Procedure Laterality Date  . COLONOSCOPY    . COLONOSCOPY N/A 02/03/2017   Dr. Hung: pancolonic diverticulosis  . LUMBAR LAMINECTOMY Right 09/11/2013   Procedure: MICRODISCECTOMY LUMBAR LAMINECTOMY;   Surgeon: Mark C Yates, MD;  Location: [facility] OR;  Service: Orthopedics;  Laterality: Right;  Right L2-3 Microdiscectomy  . LUMBAR LAMINECTOMY/DECOMPRESSION MICRODISCECTOMY N/A 06/29/2015   Procedure: Right L3-4 Microdiscectomy;  Surgeon: Mark C Yates, MD;  Location: [facility] OR;  Service: Orthopedics;  Laterality: N/A;  . TUBAL LIGATION      Prior to Admission medications   Medication Sig Start Date End Date Taking? Authorizing Provider  acetaminophen (TYLENOL) 500 MG tablet Take 500 mg by mouth every 6 (six) hours as needed for mild pain or moderate pain.   Yes [provider]  aspirin EC 81 MG tablet Take 81 mg by mouth daily.   Yes [provider]  calcium-vitamin D (OSCAL WITH D) 500-200 MG-UNIT tablet Take 2 tablets by mouth daily.   Yes [provider]  cetirizine (ZYRTEC) 10 MG tablet Take 10 mg by mouth daily as needed for allergies.   Yes [provider]  estradiol (ESTRACE) 0.1 MG/GM vaginal cream Place 1 Applicatorful vaginally 3 (three) times a week.   Yes [provider]  guaiFENesin (ROBITUSSIN) 100 MG/5ML SOLN Take 5 mLs by mouth every 4 (four) hours as needed for cough or to loosen phlegm.   Yes [provider]  meloxicam (MOBIC) 15 MG tablet Take 15 mg by mouth daily as needed for pain.  03/13/18  Yes [provider]  Multiple Vitamin (MULTIVITAMIN WITH MINERALS) TABS tablet Take 1 tablet by   mouth daily.   Yes [provider]    Current Facility-Administered Medications  Medication Dose Route Frequency Provider Last Rate Last Dose  . 0.9 %  sodium chloride infusion   Intravenous Continuous Bobette Mo, MD 100 mL/hr at 06/26/18 0058    . acetaminophen (TYLENOL) tablet 650 mg  650 mg Oral Q6H PRN Bobette Mo, MD       Or  . acetaminophen (TYLENOL) suppository 650 mg  650 mg Rectal Q6H PRN Bobette Mo, MD      . ondansetron [facility]) tablet 4 mg  4 mg Oral Q6H PRN Bobette Mo, MD        Or  . ondansetron [facility]) injection 4 mg  4 mg Intravenous Q6H PRN Bobette Mo, MD      . pantoprazole (PROTONIX) 80 mg in sodium chloride 0.9 % 250 mL (0.32 mg/mL) infusion  8 mg/hr Intravenous Continuous Bobette Mo, MD 25 mL/hr at 06/26/18 0058 8 mg/hr at 06/26/18 0058    Allergies as of 06/25/2018 - Review Complete 06/25/2018  Allergen Reaction Noted  . Nitrofurantoin Shortness Of Breath 05/14/2015  . Denosumab Swelling and Hives 07/07/2017  . Fosamax [alendronate sodium] Swelling 06/25/2018  . Sulfamethoxazole-trimethoprim Other (See Comments) 05/14/2015    Family History  Problem Relation Age of Onset  . Cancer Father   . Breast cancer Neg Hx   . Colon polyps Neg Hx     Social History   Socioeconomic History  . Marital status: Divorced    Spouse name: Not on file  . Number of children: Not on file  . Years of education: Not on file  . Highest education level: Not on file  Occupational History  . Not on file  Social Needs  . Financial resource strain: Not on file  . Food insecurity:    Worry: Not on file    Inability: Not on file  . Transportation needs:    Medical: Not on file    Non-medical: Not on file  Tobacco Use  . Smoking status: Never Smoker  . Smokeless tobacco: Never Used  Substance and Sexual Activity  . Alcohol use: No  . Drug use: No  . Sexual activity: Never  Lifestyle  . Physical activity:    Days per week: Not on file    Minutes per session: Not on file  . Stress: Not on file  Relationships  . Social connections:    Talks on phone: Not on file    Gets together: Not on file    Attends religious service: Not on file    Active member of club or organization: Not on file    Attends meetings of clubs or organizations: Not on file    Relationship status: Not on file  . Intimate partner violence:    Fear of current or ex partner: Not on file    Emotionally abused: Not on file    Physically abused: Not on file    Forced  sexual activity: Not on file  Other Topics Concern  . Not on file  Social History Narrative  . Not on file    Review of Systems: Gen: see HPI CV: Denies chest pain, heart palpitations, syncope, edema  Resp: Denies shortness of breath with rest, cough, wheezing GI: see HPI GU : Denies urinary burning, urinary frequency, urinary incontinence.  MS: Denies joint pain,swelling, cramping Derm: Denies rash, itching, dry skin Psych: Denies depression, anxiety,confusion, or memory loss Heme: see HPI  Physical  Exam: Vital signs in last 24 hours: Temp:  [98.2 F (36.8 C)-98.7 F (37.1 C)] 98.4 F (36.9 C) (03/24 0800) Pulse Rate:  [79-114] 85 (03/24 0800) Resp:  [16-28] 18 (03/24 0800) BP: (115-160)/(70-99) 115/70 (03/24 0800) SpO2:  [92 %-100 %] 94 % (03/24 0402) Weight:  [71.7 kg-75.7 kg] 75.7 kg (03/23 2341) Last BM Date: 06/25/18 General:   Alert,  Well-developed, well-nourished, pleasant and cooperative in NAD Head:  Normocephalic and atraumatic. Eyes:  Sclera clear, no icterus.   Conjunctiva pink. Ears:  Normal auditory acuity. Nose:  No deformity, discharge,  or lesions. Mouth:  No deformity or lesions, dentition normal. Lungs:  Clear throughout to auscultation.    Heart:  S1 S2 present; no murmurs, clicks, rubs,  or gallops. Abdomen:  Soft, nontender and nondistended. No masses, hepatosplenomegaly or hernias noted. Normal bowel sounds, without guarding, and without rebound.   Rectal:  Deferred Msk:  Symmetrical without gross deformities.  Extremities:  Without edema. Neurologic:  Alert and  oriented x4 Psych:  Alert and cooperative. Normal mood and affect.  Intake/Output from previous day: 03/23 0701 - 03/24 0700 In: 232.3 [I.V.:132.5; IV Piggyback:99.8] Out: -  Intake/Output this shift: No intake/output data recorded.  Lab Results: Recent Labs    06/25/18 2220 06/26/18 0419  WBC 12.7* 11.3*  HGB 13.4 10.7*  HCT 43.2 34.4*  PLT 318 270   BMET Recent Labs     06/25/18 2212  NA 138  K 4.8  CL 103  CO2 26  GLUCOSE 127*  BUN 28*  CREATININE 0.92  CALCIUM 9.0   LFT Recent Labs    06/25/18 2212  PROT 7.1  ALBUMIN 3.7  AST 20  ALT 19  ALKPHOS 57  BILITOT 0.2*   PT/INR Recent Labs    06/25/18 2212  LABPROT 12.6  INR 1.0     Impression: Pleasant 78 year old female with history of diverticular bleed several years ago, presenting with reports of dark burgundy, black stools since yesterday and acute blood loss anemia. No associated abdominal pain but does endorse mild nausea. Intermittent Mobic and takes 81 mg aspirin daily without PPI.  Vague pill dysphagia but no solid food dysphagia. Concern for UGI bleed in setting of NSAIDs but unable to exclude lower GI source with known pancolonic diverticula. Colonoscopy fairly recently by Dr. Elnoria Howard Nov 2018 with pancolonic diverticulosis, but no prior upper GI endoscopic evaluation.   Plan: Remain NPO except sips with meds EGD with Propofol by Dr Darrick Penna today Continue PPI infusion: anticipate transitioning to BID after procedure Avoid NSAIDs in setting of prior GI bleeds  Further recommendations to follow   Gelene Mink, PhD, ANP-BC [facility]     LOS: 0 days    06/26/2018, 9:19 AM

## 2018-06-26 NOTE — H&P (View-Only) (Signed)
Referring Provider: Dr. Robb Matar Primary Care Physician:  Barbie Banner, MD Primary Gastroenterologist:  Dr. Elnoria Howard   Date of Admission: 06/25/18 Date of Consultation: 06/26/18  Reason for Consultation:  GI bleed   HPI:  Stephanie Ingram is a 78 y.o. year old female presenting to the ED yesterday with rectal bleeding and syncopal episode. Prior history of likely diverticular bleed with hospitalization in Feb 2017. Last colonoscopy Nov 2018 by Dr. Elnoria Howard with diverticulosis in entire colon. Admitting Hgb 12.7, now 10.7.   Reports onset of overt bleeding yesterday around noon. Noted to be very dark, almost black, and burgundy. Several overnight. Some dark blood with clots this morning. No abdominal pain. Felt weak this morning when getting up. No vomiting. Felt nauseated. Takes Mobic every once in awhile for joint pain. 81 mg aspirin daily as well. Occasional reflux and will take Tums, about 3-4 times per month. Increasing reflux recently. No solid food dysphagia. Pill dysphagia with larger pills. Good appetite. No unintentional weight loss. +chills in the ED but afebrile. No prior EGD. No OTC NSAIDs.  Spoke with nursing staff today who stated stool was black with some red mixed in this morning.     Past Medical History:  Diagnosis Date  . Arthritis   . Chronic back pain    HNP  . GERD (gastroesophageal reflux disease)    occasionally but doesn't take any meds  . GI bleeding   . History of shingles   . Hyperlipidemia    takes Tricor daily no longer takes  . Joint pain   . Osteoporosis   . Pneumonia 10+yrs ago   hx of  . PONV (postoperative nausea and vomiting)   . Short-term memory loss   . Shortness of breath dyspnea    with exertion  . Urinary frequency     Past Surgical History:  Procedure Laterality Date  . COLONOSCOPY    . COLONOSCOPY N/A 02/03/2017   Dr. Elnoria Howard: pancolonic diverticulosis  . LUMBAR LAMINECTOMY Right 09/11/2013   Procedure: MICRODISCECTOMY LUMBAR LAMINECTOMY;   Surgeon: Eldred Manges, MD;  Location: [facility] OR;  Service: Orthopedics;  Laterality: Right;  Right L2-3 Microdiscectomy  . LUMBAR LAMINECTOMY/DECOMPRESSION MICRODISCECTOMY N/A 06/29/2015   Procedure: Right L3-4 Microdiscectomy;  Surgeon: Eldred Manges, MD;  Location: [facility] OR;  Service: Orthopedics;  Laterality: N/A;  . TUBAL LIGATION      Prior to Admission medications   Medication Sig Start Date End Date Taking? Authorizing Provider  acetaminophen (TYLENOL) 500 MG tablet Take 500 mg by mouth every 6 (six) hours as needed for mild pain or moderate pain.   Yes [provider]  aspirin EC 81 MG tablet Take 81 mg by mouth daily.   Yes [provider]  calcium-vitamin D (OSCAL WITH D) 500-200 MG-UNIT tablet Take 2 tablets by mouth daily.   Yes [provider]  cetirizine (ZYRTEC) 10 MG tablet Take 10 mg by mouth daily as needed for allergies.   Yes [provider]  estradiol (ESTRACE) 0.1 MG/GM vaginal cream Place 1 Applicatorful vaginally 3 (three) times a week.   Yes [provider]  guaiFENesin (ROBITUSSIN) 100 MG/5ML SOLN Take 5 mLs by mouth every 4 (four) hours as needed for cough or to loosen phlegm.   Yes [provider]  meloxicam (MOBIC) 15 MG tablet Take 15 mg by mouth daily as needed for pain.  03/13/18  Yes [provider]  Multiple Vitamin (MULTIVITAMIN WITH MINERALS) TABS tablet Take 1 tablet by  mouth daily.   Yes [provider]    Current Facility-Administered Medications  Medication Dose Route Frequency Provider Last Rate Last Dose  . 0.9 %  sodium chloride infusion   Intravenous Continuous Bobette Mo, MD 100 mL/hr at 06/26/18 0058    . acetaminophen (TYLENOL) tablet 650 mg  650 mg Oral Q6H PRN Bobette Mo, MD       Or  . acetaminophen (TYLENOL) suppository 650 mg  650 mg Rectal Q6H PRN Bobette Mo, MD      . ondansetron [facility]) tablet 4 mg  4 mg Oral Q6H PRN Bobette Mo, MD        Or  . ondansetron [facility]) injection 4 mg  4 mg Intravenous Q6H PRN Bobette Mo, MD      . pantoprazole (PROTONIX) 80 mg in sodium chloride 0.9 % 250 mL (0.32 mg/mL) infusion  8 mg/hr Intravenous Continuous Bobette Mo, MD 25 mL/hr at 06/26/18 0058 8 mg/hr at 06/26/18 0058    Allergies as of 06/25/2018 - Review Complete 06/25/2018  Allergen Reaction Noted  . Nitrofurantoin Shortness Of Breath 05/14/2015  . Denosumab Swelling and Hives 07/07/2017  . Fosamax [alendronate sodium] Swelling 06/25/2018  . Sulfamethoxazole-trimethoprim Other (See Comments) 05/14/2015    Family History  Problem Relation Age of Onset  . Cancer Father   . Breast cancer Neg Hx   . Colon polyps Neg Hx     Social History   Socioeconomic History  . Marital status: Divorced    Spouse name: Not on file  . Number of children: Not on file  . Years of education: Not on file  . Highest education level: Not on file  Occupational History  . Not on file  Social Needs  . Financial resource strain: Not on file  . Food insecurity:    Worry: Not on file    Inability: Not on file  . Transportation needs:    Medical: Not on file    Non-medical: Not on file  Tobacco Use  . Smoking status: Never Smoker  . Smokeless tobacco: Never Used  Substance and Sexual Activity  . Alcohol use: No  . Drug use: No  . Sexual activity: Never  Lifestyle  . Physical activity:    Days per week: Not on file    Minutes per session: Not on file  . Stress: Not on file  Relationships  . Social connections:    Talks on phone: Not on file    Gets together: Not on file    Attends religious service: Not on file    Active member of club or organization: Not on file    Attends meetings of clubs or organizations: Not on file    Relationship status: Not on file  . Intimate partner violence:    Fear of current or ex partner: Not on file    Emotionally abused: Not on file    Physically abused: Not on file    Forced  sexual activity: Not on file  Other Topics Concern  . Not on file  Social History Narrative  . Not on file    Review of Systems: Gen: see HPI CV: Denies chest pain, heart palpitations, syncope, edema  Resp: Denies shortness of breath with rest, cough, wheezing GI: see HPI GU : Denies urinary burning, urinary frequency, urinary incontinence.  MS: Denies joint pain,swelling, cramping Derm: Denies rash, itching, dry skin Psych: Denies depression, anxiety,confusion, or memory loss Heme: see HPI  Physical  Exam: Vital signs in last 24 hours: Temp:  [98.2 F (36.8 C)-98.7 F (37.1 C)] 98.4 F (36.9 C) (03/24 0800) Pulse Rate:  [79-114] 85 (03/24 0800) Resp:  [16-28] 18 (03/24 0800) BP: (115-160)/(70-99) 115/70 (03/24 0800) SpO2:  [92 %-100 %] 94 % (03/24 0402) Weight:  [71.7 kg-75.7 kg] 75.7 kg (03/23 2341) Last BM Date: 06/25/18 General:   Alert,  Well-developed, well-nourished, pleasant and cooperative in NAD Head:  Normocephalic and atraumatic. Eyes:  Sclera clear, no icterus.   Conjunctiva pink. Ears:  Normal auditory acuity. Nose:  No deformity, discharge,  or lesions. Mouth:  No deformity or lesions, dentition normal. Lungs:  Clear throughout to auscultation.    Heart:  S1 S2 present; no murmurs, clicks, rubs,  or gallops. Abdomen:  Soft, nontender and nondistended. No masses, hepatosplenomegaly or hernias noted. Normal bowel sounds, without guarding, and without rebound.   Rectal:  Deferred Msk:  Symmetrical without gross deformities.  Extremities:  Without edema. Neurologic:  Alert and  oriented x4 Psych:  Alert and cooperative. Normal mood and affect.  Intake/Output from previous day: 03/23 0701 - 03/24 0700 In: 232.3 [I.V.:132.5; IV Piggyback:99.8] Out: -  Intake/Output this shift: No intake/output data recorded.  Lab Results: Recent Labs    06/25/18 2220 06/26/18 0419  WBC 12.7* 11.3*  HGB 13.4 10.7*  HCT 43.2 34.4*  PLT 318 270   BMET Recent Labs     06/25/18 2212  NA 138  K 4.8  CL 103  CO2 26  GLUCOSE 127*  BUN 28*  CREATININE 0.92  CALCIUM 9.0   LFT Recent Labs    06/25/18 2212  PROT 7.1  ALBUMIN 3.7  AST 20  ALT 19  ALKPHOS 57  BILITOT 0.2*   PT/INR Recent Labs    06/25/18 2212  LABPROT 12.6  INR 1.0     Impression: Pleasant 78 year old female with history of diverticular bleed several years ago, presenting with reports of dark burgundy, black stools since yesterday and acute blood loss anemia. No associated abdominal pain but does endorse mild nausea. Intermittent Mobic and takes 81 mg aspirin daily without PPI.  Vague pill dysphagia but no solid food dysphagia. Concern for UGI bleed in setting of NSAIDs but unable to exclude lower GI source with known pancolonic diverticula. Colonoscopy fairly recently by Dr. Elnoria Howard Nov 2018 with pancolonic diverticulosis, but no prior upper GI endoscopic evaluation.   Plan: Remain NPO except sips with meds EGD with Propofol by Dr Darrick Penna today Continue PPI infusion: anticipate transitioning to BID after procedure Avoid NSAIDs in setting of prior GI bleeds  Further recommendations to follow   Gelene Mink, PhD, ANP-BC [facility]     LOS: 0 days    06/26/2018, 9:19 AM

## 2018-06-26 NOTE — Interval H&P Note (Signed)
History and Physical Interval Note:  06/26/2018 12:58 PM  Stephanie Ingram  has presented today for surgery, with the diagnosis of acute blood loss anemia, GI bleed.  The various methods of treatment have been discussed with the patient and family. After consideration of risks, benefits and other options for treatment, the patient has consented to  Procedure(s): ESOPHAGOGASTRODUODENOSCOPY (EGD) WITH PROPOFOL (N/A) as a surgical intervention.  The patient's history has been reviewed, patient examined, no change in status, stable for surgery.  I have reviewed the patient's chart and labs.  Questions were answered to the patient's satisfaction.     [employer]

## 2018-06-26 NOTE — Care Management Obs Status (Signed)
MEDICARE OBSERVATION STATUS NOTIFICATION   Patient Details  Name: Stephanie Ingram MRN: [id number] Date of Birth: 08/06/1940   Medicare Observation Status Notification Given:  Yes    Boss Danielsen, Chrystine Oiler, RN 06/26/2018, 9:06 AM

## 2018-06-26 NOTE — Anesthesia Postprocedure Evaluation (Signed)
Anesthesia Post Note  Patient: Stephanie Ingram  Procedure(s) Performed: ESOPHAGOGASTRODUODENOSCOPY (EGD) WITH PROPOFOL (N/A )  Patient location during evaluation: PACU Anesthesia Type: MAC Level of consciousness: awake and alert and patient cooperative Pain management: satisfactory to patient Vital Signs Assessment: post-procedure vital signs reviewed and stable Respiratory status: spontaneous breathing Cardiovascular status: stable Postop Assessment: no apparent nausea or vomiting Anesthetic complications: no     Last Vitals:  Vitals:   06/26/18 1110 06/26/18 1324  BP: 118/75 (!) 100/54  Pulse: 80 82  Resp: 16 (!) 21  Temp: 36.8 C 36.6 C  SpO2: 94% 97%    Last Pain:  Vitals:   06/26/18 1324  TempSrc:   PainSc: 0-No pain                 Marien Manship

## 2018-06-26 NOTE — Progress Notes (Signed)
PROGRESS NOTE  Stephanie Ingram  JQB:[id number]  DOB: 1940-08-26  DOA: 06/25/2018 PCP: Barbie Banner, MD  Brief Admission Hx: 78 y.o. female with medical history significant of osteoarthritis, chronic back pain, GERD, history of lower GI bleed, shingles, hyperlipidemia, osteoporosis, pneumonia, short-term memory loss, urinary frequency who is coming to the emergency department after having rectal bleeding followed by lightheadedness and a syncopal episode while on the toilet.   MDM/Assessment & Plan:   1. Melena - Pt has been started on supportive care and started on IV Protonix infusion.  Monitoring hemoglobin.  Transfuse as needed.  GI consult requested.  Further evaluation pending GI consult. 2. Acute blood loss anemia-secondary to GI bleeding, hemoglobin down to.  We will continue to monitor and transfuse if needed. 3. Chronic lower back pain- analgesics as ordered. 4. GERD- continue Protonix infusion. 5. Vasovagal syncope - pt passed out while on toilet, symptoms likely exacerbated by GI bleeding.  Fall precautions.  PT eval when medically stabilized.   DVT prophylaxis: SCDs Code Status: Full code Family Communication: Patient, no family at bedside. Disposition Plan: Home when medically stabilized  Consultants:  GI   Procedures:    Antimicrobials:     Subjective: Pt says she has no abdominal pain or nausea.  She feels weak.   Objective: Vitals:   06/25/18 2341 06/26/18 0059 06/26/18 0402 06/26/18 0800  BP: 126/80 139/79 119/73 115/70  Pulse: (!) 101 83 84 85  Resp: 17 18 16 18   Temp: 98.3 F (36.8 C)  98.2 F (36.8 C) 98.4 F (36.9 C)  TempSrc: Oral  Oral   SpO2: 98% 98% 94%   Weight: 75.7 kg     Height: 5\' 4"  (1.626 m)       Intake/Output Summary (Last 24 hours) at 06/26/2018 4097 Last data filed at 06/26/2018 0700 Gross per 24 hour  Intake 232.31 ml  Output -  Net 232.31 ml   Filed Weights   06/25/18 2159 06/25/18 2341  Weight: 71.7 kg 75.7 kg   REVIEW OF SYSTEMS  As per history otherwise all reviewed and reported negative  Exam:  General exam: Elderly female, lying in bed in no apparent distress. Respiratory system: Clear. No increased work of breathing. Cardiovascular system: Normal S1-S2 sounds.  No JVD, murmurs, gallops, clicks or pedal edema. Gastrointestinal system: Abdomen is nondistended, soft and nontender. Normal bowel sounds heard. Central nervous system: Alert and oriented. No focal neurological deficits. Extremities: no cyanosis or clubbing.  Data Reviewed: Basic Metabolic Panel: Recent Labs  Lab 06/25/18 2212  NA 138  K 4.8  CL 103  CO2 26  GLUCOSE 127*  BUN 28*  CREATININE 0.92  CALCIUM 9.0   Liver Function Tests: Recent Labs  Lab 06/25/18 2212  AST 20  ALT 19  ALKPHOS 57  BILITOT 0.2*  PROT 7.1  ALBUMIN 3.7   No results for input(s): LIPASE, AMYLASE in the last 168 hours. No results for input(s): AMMONIA in the last 168 hours. CBC: Recent Labs  Lab 06/25/18 2220 06/26/18 0419  WBC 12.7* 11.3*  HGB 13.4 10.7*  HCT 43.2 34.4*  MCV 98.2 98.6  PLT 318 270   Cardiac Enzymes: No results for input(s): CKTOTAL, CKMB, CKMBINDEX, TROPONINI in the last 168 hours. CBG (last 3)  No results for input(s): GLUCAP in the last 72 hours. No results found for this or any previous visit (from the past 240 hour(s)).   Studies: No results found.  Scheduled Meds: Continuous Infusions: . sodium chloride  100 mL/hr at 06/26/18 0058  . pantoprozole (PROTONIX) infusion 8 mg/hr (06/26/18 0058)   Principal Problem:   Melena Active Problems:   Hematochezia   Chronic lower back pain   HLD (hyperlipidemia)   GERD (gastroesophageal reflux disease)  Time spent:   Standley Dakins, MD [facility] 06/26/2018, 9:39 AM    LOS: 0 days  How to contact the [facility] Attending or Consulting provider 7A - 7P or covering provider during after hours 7P -7A, for this patient?  1. Check the care team in [facility]  and look for a) attending/consulting [facility] provider listed and b) the [facility] team listed 2. Log into [email or web address] and use [facility] universal password to access. If you do not have the password, please contact the hospital operator. 3. Locate the [facility] provider you are looking for under [facility] and page to a number that you can be directly reached. 4. If you still have difficulty reaching the provider, please page the [facility] (Director on Call) for the Hospitalists listed on amion for assistance.

## 2018-06-26 NOTE — Op Note (Signed)
[facility] Patient Name: Stephanie CyphersMozelle Hunnell Procedure Date: 06/26/2018 12:29 PM MRN: [id number] Date of Birth: [birth year] Attending MD: Jonette EvaSandi Katriana Dortch MD, MD CSN: [id number] Age: [age] Admit Type: Inpatient Procedure:                Upper GI endoscopy, DIAGNOSTIC Indications:              Active gastrointestinal bleeding: MAROON STOOL                            WHILE TAKING ASA DAILY/MOBIC PRN Providers:                Jonette EvaSandi Meyah Corle MD, MD, Edrick Kinsammy Vaught, RN, Burke Keelsrisann                            Tilley, Technician Referring MD:             Gloriajean DellFred H. Wilson Medicines:                Propofol per Anesthesia Complications:            No immediate complications. Estimated Blood Loss:     Estimated blood loss: none. Procedure:                Pre-Anesthesia Assessment:                           - Prior to the procedure, a History and Physical                            was performed, and patient medications and                            allergies were reviewed. The patient's tolerance of                            previous anesthesia was also reviewed. The risks                            and benefits of the procedure and the sedation                            options and risks were discussed with the patient.                            All questions were answered, and informed consent                            was obtained. Prior Anticoagulants: The patient has                            taken aspirin, last dose was 2 days prior to                            procedure. ASA Grade Assessment: II - A patient  with mild systemic disease. After reviewing the                            risks and benefits, the patient was deemed in                            satisfactory condition to undergo the procedure.                            After obtaining informed consent, the endoscope was                            passed under direct vision. Throughout the        procedure, the patient's blood pressure, pulse, and                            oxygen saturations were monitored continuously. The                            [id number]-H190 ([id number]) scope was introduced through the                            mouth, and advanced to the second part of duodenum.                            The upper GI endoscopy was accomplished without                            difficulty. The patient tolerated the procedure                            well. Scope In: 1:15:37 PM Scope Out: 1:17:50 PM Total Procedure Duration: 0 hours 2 minutes 13 seconds  Findings:      A low-grade of narrowing Schatzki ring was found at the gastroesophageal       junction.      The entire examined stomach was normal.      The examined duodenum was normal.      -NO OLD BLOOD OR FRESH BLOOD SEEN IN THE STOMACH OR DUODENUM. Impression:               - Low-grade of narrowing Schatzki ring.                           - NO SOURCE FOR MAROON STOOLS IDENTIFIED AND IT IS                            MOST LIKELY DUE TO DIVERTICULAR BLEED FROM RIGHT                            COLON Moderate Sedation:      Per Anesthesia Care Recommendation:           - Return patient to Ingram ward for ongoing care.                           -  Low fat diet. NO INDICATION FOR REPEAT                            COLONOSCOPY.                           - Continue present medications. HOLD ASA/NSIADs                            INDEFINTELY UNLESS THEY ARE MEDICALLY NECESSARY.                           - Await pathology results.                           - Return to GI office: DR. HUNG in 4 months.                            Consider given sstuyd if maroon stools persist. Procedure Code(s):        --- Professional ---                           [phone], Esophagogastroduodenoscopy, flexible,                            transoral; diagnostic, including collection of                            specimen(s) by brushing or washing,  when performed                            (separate procedure) Diagnosis Code(s):        --- Professional ---                           K22.2, Esophageal obstruction                           K92.2, Gastrointestinal hemorrhage, unspecified CPT copyright 2018 American Medical Association. All rights reserved. The codes documented in this report are preliminary and upon coder review may  be revised to meet current compliance requirements. Jonette Eva, MD Jonette Eva MD, MD 06/26/2018 1:44:15 PM This report has been signed electronically. Number of Addenda: 0

## 2018-06-27 ENCOUNTER — Encounter (HOSPITAL_COMMUNITY): Payer: Self-pay | Admitting: Gastroenterology

## 2018-06-27 DIAGNOSIS — K219 Gastro-esophageal reflux disease without esophagitis: Secondary | ICD-10-CM | POA: Diagnosis not present

## 2018-06-27 DIAGNOSIS — K922 Gastrointestinal hemorrhage, unspecified: Secondary | ICD-10-CM | POA: Diagnosis not present

## 2018-06-27 DIAGNOSIS — K921 Melena: Secondary | ICD-10-CM | POA: Diagnosis not present

## 2018-06-27 LAB — CBC
HCT: 31.3 % — ABNORMAL LOW (ref 36.0–46.0)
Hemoglobin: 9.9 g/dL — ABNORMAL LOW (ref 12.0–15.0)
MCH: 30.8 pg (ref 26.0–34.0)
MCHC: 31.6 g/dL (ref 30.0–36.0)
MCV: 97.5 fL (ref 80.0–100.0)
Platelets: 247 10*3/uL (ref 150–400)
RBC: 3.21 MIL/uL — ABNORMAL LOW (ref 3.87–5.11)
RDW: 13.6 % (ref 11.5–15.5)
WBC: 7.8 10*3/uL (ref 4.0–10.5)
nRBC: 0 % (ref 0.0–0.2)

## 2018-06-27 LAB — HEMOGLOBIN AND HEMATOCRIT, BLOOD
HCT: 32.1 % — ABNORMAL LOW (ref 36.0–46.0)
Hemoglobin: 10 g/dL — ABNORMAL LOW (ref 12.0–15.0)

## 2018-06-27 NOTE — Progress Notes (Signed)
  Subjective:  No abdominal pain. Tolerating diet. Last BM 8pm last night (black/red per nursing report). Patient feels like bleeding less with last BM.   Objective: Vital signs in last 24 hours: Temp:  [97.8 F (36.6 C)-99 F (37.2 C)] 98.6 F (37 C) (03/25 0537) Pulse Rate:  [77-93] 79 (03/25 0537) Resp:  [16-21] 20 (03/25 0537) BP: (100-151)/(54-95) 151/80 (03/25 0537) SpO2:  [93 %-97 %] 93 % (03/25 0537) Last BM Date: 06/26/18 General:   Alert,  Well-developed, well-nourished, pleasant and cooperative in NAD Head:  Normocephalic and atraumatic. Eyes:  Sclera clear, no icterus.  Abdomen:  Soft, nontender and nondistended.   Normal bowel sounds, without guarding, and without rebound.   Extremities:  Without clubbing, deformity or edema. Neurologic:  Alert and  oriented x4;  grossly normal neurologically. Psych:  Alert and cooperative. Normal mood and affect.  Intake/Output from previous day: 03/24 0701 - 03/25 0700 In: 1709.2 [P.O.:360; I.V.:1349.2] Out: -  Intake/Output this shift: No intake/output data recorded.  Lab Results: CBC Recent Labs    06/25/18 2220 06/26/18 0419 06/27/18 0436  WBC 12.7* 11.3* 7.8  HGB 13.4 10.7* 9.9*  HCT 43.2 34.4* 31.3*  MCV 98.2 98.6 97.5  PLT 318 270 247   BMET Recent Labs    06/25/18 2212  NA 138  K 4.8  CL 103  CO2 26  GLUCOSE 127*  BUN 28*  CREATININE 0.92  CALCIUM 9.0   LFTs Recent Labs    06/25/18 2212  BILITOT 0.2*  ALKPHOS 57  AST 20  ALT 19  PROT 7.1  ALBUMIN 3.7   No results for input(s): LIPASE in the last 72 hours. PT/INR Recent Labs    06/25/18 2212  LABPROT 12.6  INR 1.0      Imaging Studies: No results found.[2 weeks]   Assessment:  78 y/o female with history of diverticular bleed several years ago, presenting with reports of dark burgundy, black stools and acute blood loss anemia. Intermittent mobic and ASA without PPI. Vague pill dysphagia. EGD showed low-grade of narrowing Schatzki  ring. No source identified for maroon stools/melena. Suspected GI bleed from diverticular bleed from possibly right colon.  Hemoglobin down to 9.9 from 10.7, 24 hours ago. Down from 13.4 two days ago. Patient reports bleeding has significantly slowed with last BM 2015 last night (nursing reported black/red).  Plan: 1. HOLD ASA/NSAIDS indefinitely unless medically necessary.  2. Return to Dr. Elnoria Howard in four months.  3. Consider givens study if maroon stools persist.  4. Recheck H/H at noon. Consider discharge once H/H stabilize and no significant bleeding.   Leanna Battles. [place] [facility] [phone] 3/25/20208:36 AM      LOS: 0 days

## 2018-06-27 NOTE — Plan of Care (Signed)

## 2018-06-27 NOTE — Progress Notes (Signed)
PROGRESS NOTE  Stephanie Ingram  HYW:[id number]  DOB: Jul 27, 1940  DOA: 06/25/2018 PCP: Barbie Banner, MD  Brief Admission Hx: 78 y.o. female with medical history significant of osteoarthritis, chronic back pain, GERD, history of lower GI bleed, shingles, hyperlipidemia, osteoporosis, pneumonia, short-term memory loss, urinary frequency who is coming to the emergency department after having rectal bleeding followed by lightheadedness and a syncopal episode while on the toilet.   MDM/Assessment & Plan:   1. Melena - Pt has been started on supportive care and started on IV Protonix infusion.  Monitoring hemoglobin.  Transfuse as needed.  GI consult requested.  EGD showed low-grade narrowing Schatzki ring.  No source identified for melena.  Suspect from diverticular bleed from right colon.  Pt had small blood stool this morning. Will monitor another day to be sure hemoglobin stabilized and bleeding stopped.  2. Acute blood loss anemia-secondary to GI bleeding, hemoglobin down to 9.9.   We will continue to monitor.  3. Chronic lower back pain- analgesics as ordered. 4. GERD- continue Protonix infusion. 5. Vasovagal syncope - pt passed out while on toilet, symptoms likely exacerbated by GI bleeding.  Fall precautions.  PT eval when medically stabilized.   DVT prophylaxis: SCDs Code Status: Full code Family Communication: Patient, no family at bedside. Disposition Plan: Home when medically stabilized  Consultants:  GI   Procedures:    Antimicrobials:     Subjective: Pt says she had a small bloody stool this morning.  No other complaints.    Objective: Vitals:   06/26/18 1430 06/26/18 1922 06/26/18 2004 06/27/18 0537  BP: (!) 125/95  (!) 120/59 (!) 151/80  Pulse: 93  82 79  Resp: 19  20 20   Temp: 98.2 F (36.8 C)  99 F (37.2 C) 98.6 F (37 C)  TempSrc: Oral  Oral Oral  SpO2: 95% 93% 96% 93%  Weight:      Height:        Intake/Output Summary (Last 24 hours) at  06/27/2018 1309 Last data filed at 06/27/2018 0859 Gross per 24 hour  Intake 1949.17 ml  Output -  Net 1949.17 ml   Filed Weights   06/25/18 2159 06/25/18 2341  Weight: 71.7 kg 75.7 kg   REVIEW OF SYSTEMS  As per history otherwise all reviewed and reported negative  Exam:  General exam: Elderly female, lying in bed in no apparent distress. Respiratory system: Clear. No increased work of breathing. Cardiovascular system: Normal S1-S2 sounds.  No JVD, murmurs, gallops, clicks or pedal edema. Gastrointestinal system: Abdomen is nondistended, soft and nontender. Normal bowel sounds heard. Central nervous system: Alert and oriented. No focal neurological deficits. Extremities: no cyanosis or clubbing.  Data Reviewed: Basic Metabolic Panel: Recent Labs  Lab 06/25/18 2212  NA 138  K 4.8  CL 103  CO2 26  GLUCOSE 127*  BUN 28*  CREATININE 0.92  CALCIUM 9.0   Liver Function Tests: Recent Labs  Lab 06/25/18 2212  AST 20  ALT 19  ALKPHOS 57  BILITOT 0.2*  PROT 7.1  ALBUMIN 3.7   No results for input(s): LIPASE, AMYLASE in the last 168 hours. No results for input(s): AMMONIA in the last 168 hours. CBC: Recent Labs  Lab 06/25/18 2220 06/26/18 0419 06/27/18 0436  WBC 12.7* 11.3* 7.8  HGB 13.4 10.7* 9.9*  HCT 43.2 34.4* 31.3*  MCV 98.2 98.6 97.5  PLT 318 270 247   Cardiac Enzymes: No results for input(s): CKTOTAL, CKMB, CKMBINDEX, TROPONINI in the last 168  hours. CBG (last 3)  No results for input(s): GLUCAP in the last 72 hours. No results found for this or any previous visit (from the past 240 hour(s)).   Studies: No results found.  Scheduled Meds: . pantoprazole  40 mg Oral QAC breakfast   Continuous Infusions: . sodium chloride 50 mL/hr at 06/27/18 0544   Principal Problem:   Melena Active Problems:   Hematochezia   Chronic lower back pain   HLD (hyperlipidemia)   GERD (gastroesophageal reflux disease)   Acute GI bleeding  Time spent:    Standley Dakins, MD [facility] 06/27/2018, 1:09 PM    LOS: 0 days  How to contact the [facility] Attending or Consulting provider 7A - 7P or covering provider during after hours 7P -7A, for this patient?  1. Check the care team in [facility] and look for a) attending/consulting [facility] provider listed and b) the [facility] team listed 2. Log into [email or web address] and use [facility]'s universal password to access. If you do not have the password, please contact the hospital operator. 3. Locate the [facility] provider you are looking for under [facility] and page to a number that you can be directly reached. 4. If you still have difficulty reaching the provider, please page the [facility] (Director on Call) for the Hospitalists listed on amion for assistance.

## 2018-06-28 DIAGNOSIS — D62 Acute posthemorrhagic anemia: Secondary | ICD-10-CM | POA: Diagnosis not present

## 2018-06-28 DIAGNOSIS — K922 Gastrointestinal hemorrhage, unspecified: Secondary | ICD-10-CM | POA: Diagnosis not present

## 2018-06-28 DIAGNOSIS — M549 Dorsalgia, unspecified: Secondary | ICD-10-CM | POA: Diagnosis present

## 2018-06-28 DIAGNOSIS — M810 Age-related osteoporosis without current pathological fracture: Secondary | ICD-10-CM | POA: Diagnosis present

## 2018-06-28 DIAGNOSIS — K2970 Gastritis, unspecified, without bleeding: Secondary | ICD-10-CM | POA: Diagnosis present

## 2018-06-28 DIAGNOSIS — G8929 Other chronic pain: Secondary | ICD-10-CM | POA: Diagnosis present

## 2018-06-28 DIAGNOSIS — Z888 Allergy status to other drugs, medicaments and biological substances status: Secondary | ICD-10-CM | POA: Diagnosis not present

## 2018-06-28 DIAGNOSIS — Z8619 Personal history of other infectious and parasitic diseases: Secondary | ICD-10-CM | POA: Diagnosis not present

## 2018-06-28 DIAGNOSIS — M545 Low back pain: Secondary | ICD-10-CM | POA: Diagnosis present

## 2018-06-28 DIAGNOSIS — K222 Esophageal obstruction: Secondary | ICD-10-CM | POA: Diagnosis present

## 2018-06-28 DIAGNOSIS — K529 Noninfective gastroenteritis and colitis, unspecified: Secondary | ICD-10-CM | POA: Diagnosis present

## 2018-06-28 DIAGNOSIS — K921 Melena: Secondary | ICD-10-CM | POA: Diagnosis present

## 2018-06-28 DIAGNOSIS — Z79899 Other long term (current) drug therapy: Secondary | ICD-10-CM | POA: Diagnosis not present

## 2018-06-28 DIAGNOSIS — R55 Syncope and collapse: Secondary | ICD-10-CM

## 2018-06-28 DIAGNOSIS — E785 Hyperlipidemia, unspecified: Secondary | ICD-10-CM | POA: Diagnosis present

## 2018-06-28 DIAGNOSIS — K5731 Diverticulosis of large intestine without perforation or abscess with bleeding: Secondary | ICD-10-CM | POA: Diagnosis present

## 2018-06-28 DIAGNOSIS — Z7982 Long term (current) use of aspirin: Secondary | ICD-10-CM | POA: Diagnosis not present

## 2018-06-28 DIAGNOSIS — K219 Gastro-esophageal reflux disease without esophagitis: Secondary | ICD-10-CM | POA: Diagnosis present

## 2018-06-28 LAB — CBC
HCT: 30.8 % — ABNORMAL LOW (ref 36.0–46.0)
Hemoglobin: 9.7 g/dL — ABNORMAL LOW (ref 12.0–15.0)
MCH: 30.8 pg (ref 26.0–34.0)
MCHC: 31.5 g/dL (ref 30.0–36.0)
MCV: 97.8 fL (ref 80.0–100.0)
PLATELETS: 246 10*3/uL (ref 150–400)
RBC: 3.15 MIL/uL — ABNORMAL LOW (ref 3.87–5.11)
RDW: 13.5 % (ref 11.5–15.5)
WBC: 9.2 10*3/uL (ref 4.0–10.5)
nRBC: 0 % (ref 0.0–0.2)

## 2018-06-28 MED ORDER — BOOST / RESOURCE BREEZE PO LIQD CUSTOM
1.0000 | Freq: Three times a day (TID) | ORAL | Status: DC
  Administered 2018-06-28 – 2018-06-30 (×7): 1 via ORAL

## 2018-06-28 MED ORDER — SODIUM CHLORIDE 0.9 % IV SOLN
INTRAVENOUS | Status: DC
  Administered 2018-06-28 – 2018-06-29 (×2): via INTRAVENOUS

## 2018-06-28 NOTE — Evaluation (Signed)
Physical Therapy Evaluation Patient Details Name: WALLY LEGERSKI MRN: [id number] DOB: [birth year] Today's Date: 06/28/2018   History of Present Illness  Jaaliyah [address] is a 78 y.o. female with medical history significant of Osteoarthritis, chronic back pain, GERD, history of lower GI bleed, shingles, hyperlipidemia, osteoporosis, pneumonia, short-term memory loss, urinary frequency who is coming to the emergency department after having rectal bleeding followed by lightheadedness and a syncopal episode while in the toilet today.  She states that initially the bleeding was maroon in color and now is dark red.  She also states that her stools lately have been looking darker than usual.  She denies abdominal pain, nausea, emesis, constipation, dysuria or hematuria.  Chest pain, dyspnea, palpitations, diaphoresis, PND, orthopnea or current pitting edema lower extremities.  Denies fever, chills, sore throat, rhinorrhea, wheezing or hemoptysis.  Denies polyuria, polydipsia, polyphagia or blurred vision.  Clinical Impression  Pt received in bed and was agreeable to PT evaluation. Pt from home with son where she was independent with ambulation without AD, independent with ADLs, and some IADLs, still driving, and able to ambulate limited community distances, only limited due to chronic hip pain. Pt currently independent with bed mobility, mod I transfers, and supervision for amb with RW this date. Pt able to ambulate 117ft with RW and supervision. Pt left in chair, call bell/phone within reach and RN made aware; also informed RN that pt had another bloody BM prior to ambulating with PT. PT recommending OPPT to address her c/o chronic hip pain once she is medically ready to d/c. Will continue to follow acutely.      Follow Up Recommendations Outpatient PT    Equipment Recommendations  None recommended by PT    Recommendations for Other Services       Precautions / Restrictions  Precautions Precautions: None Precaution Comments: only needs assistance to manage IV pole      Mobility  Bed Mobility Overal bed mobility: Independent                Transfers Overall transfer level: Modified independent Equipment used: None                Ambulation/Gait Ambulation/Gait assistance: Supervision Gait Distance (Feet): 150 Feet Assistive device: Rolling walker (2 wheeled) Gait Pattern/deviations: Step-through pattern;Decreased stride length;Antalgic     General Gait Details: c/o hip pain during WB activities, supv for amb due to pt report of syncope PTA, but otherwise, good cadence and speed  Stairs            Wheelchair Mobility    Modified Rankin (Stroke Patients Only)       Balance Overall balance assessment: Modified Independent;Needs assistance   Sitting balance-Leahy Scale: Normal     Standing balance support: Bilateral upper extremity supported;No upper extremity supported Standing balance-Leahy Scale: Fair Standing balance comment: good/fair with and without RW                             Pertinent Vitals/Pain Pain Assessment: No/denies pain    Home Living Family/patient expects to be discharged to:: Private residence Living Arrangements: Alone Available Help at Discharge: Family Type of Home: House Home Access: Stairs to enter Entrance Stairs-Rails: None Entrance Stairs-Number of Steps: 3-4 Home Layout: One level Home Equipment: Cane - single point;Walker - 4 wheels;Bedside commode      Prior Function Level of Independence: Independent         Comments: pt  reports that she was indepenent with ADLs, cleans and cooks some, but states this and her ambulation were limited due to hip pain, not general fatigue, deconditioning, etc. Walked limited community distances     Hand Dominance   Dominant Hand: Right    Extremity/Trunk Assessment   Upper Extremity Assessment Upper Extremity Assessment:  Overall WFL for tasks assessed    Lower Extremity Assessment Lower Extremity Assessment: Overall WFL for tasks assessed    Cervical / Trunk Assessment Cervical / Trunk Assessment: Normal  Communication   Communication: No difficulties  Cognition Arousal/Alertness: Awake/alert Behavior During Therapy: WFL for tasks assessed/performed Overall Cognitive Status: Within Functional Limits for tasks assessed                                        General Comments      Exercises     Assessment/Plan    PT Assessment Patient needs continued PT services  PT Problem List Decreased strength;Pain;Decreased activity tolerance       PT Treatment Interventions DME instruction;Gait training;Stair training;Functional mobility training;Therapeutic activities;Therapeutic exercise;Balance training;Neuromuscular re-education;Patient/family education;Manual techniques    PT Goals (Current goals can be found in the Care Plan section)  Acute Rehab PT Goals Patient Stated Goal: get stronger and go home PT Goal Formulation: With patient Time For Goal Achievement: 07/05/18 Potential to Achieve Goals: Good    Frequency Min 3X/week   Barriers to discharge        Co-evaluation               AM-PAC PT "6 Clicks" Mobility  Outcome Measure Help needed turning from your back to your side while in a flat bed without using bedrails?: None Help needed moving from lying on your back to sitting on the side of a flat bed without using bedrails?: None Help needed moving to and from a bed to a chair (including a wheelchair)?: None Help needed standing up from a chair using your arms (e.g., wheelchair or bedside chair)?: None Help needed to walk in hospital room?: A Little Help needed climbing 3-5 steps with a railing? : A Little 6 Click Score: 22    End of Session Equipment Utilized During Treatment: Gait belt Activity Tolerance: Patient tolerated treatment well Patient left: in  chair;with call bell/phone within reach Nurse Communication: Mobility status(RN notified of pt in chair and that pt had 1 BM that was bloody prior to PT ambulating with pt) PT Visit Diagnosis: Muscle weakness (generalized) (M62.81);Difficulty in walking, not elsewhere classified (R26.2);Other abnormalities of gait and mobility (R26.89)    Time: 2010-0712 PT Time Calculation (min) (ACUTE ONLY): 19 min   Charges:   PT Evaluation $PT Eval Low Complexity: 1 Low             Jac Canavan PT, DPT

## 2018-06-28 NOTE — Progress Notes (Signed)
PROGRESS NOTE  Stephanie Ingram  QKM:[id number]  DOB: 15-Jun-1940  DOA: 06/25/2018 PCP: Barbie Banner, MD  Brief Admission Hx: 78 y.o. female with medical history significant of osteoarthritis, chronic back pain, GERD, history of lower GI bleed, shingles, hyperlipidemia, osteoporosis, pneumonia, short-term memory loss, urinary frequency who is coming to the emergency department after having rectal bleeding followed by lightheadedness and a syncopal episode while on the toilet.   MDM/Assessment & Plan:   1. Melena - Pt has been started on supportive care and started on IV Protonix infusion.  Monitoring hemoglobin.  Transfuse as needed.  GI consult requested.  EGD showed low-grade narrowing Schatzki ring.  No source identified for melena.  Suspect from diverticular bleed from right colon.  She continues to have small amounts of blood in her stool.  Plan is for EGD in a.m. with capsule placement. 2. Acute blood loss anemia-secondary to GI bleeding, hemoglobin stable at 9.7.   We will continue to monitor.  3. Chronic lower back pain- analgesics as ordered. 4. GERD- continue Protonix infusion. 5. Vasovagal syncope - pt passed out while on toilet, symptoms likely exacerbated by GI bleeding.  Fall precautions.  Seen by PT with recommendations for outpatient PT.   DVT prophylaxis: SCDs Code Status: Full code Family Communication: Patient, no family at bedside. Disposition Plan: Home when medically stabilized  Consultants:  GI   Procedures:    Antimicrobials:     Subjective: Continues to have small amounts of blood in stool.  No lightheadedness or dizziness.  No nausea or vomiting.  Objective: Vitals:   06/27/18 1345 06/27/18 2112 06/28/18 0518 06/28/18 1337  BP: 120/62 (!) 120/56 116/62 121/69  Pulse: 88 83 75 87  Resp: 20 20 16 19   Temp: 98.3 F (36.8 C) 98.2 F (36.8 C) 98.7 F (37.1 C) 98.2 F (36.8 C)  TempSrc:  Oral Oral Oral  SpO2: 95% 95% 95% 97%  Weight:       Height:        Intake/Output Summary (Last 24 hours) at 06/28/2018 1851 Last data filed at 06/28/2018 1646 Gross per 24 hour  Intake 1623.83 ml  Output -  Net 1623.83 ml   Filed Weights   06/25/18 2159 06/25/18 2341  Weight: 71.7 kg 75.7 kg   REVIEW OF SYSTEMS  As per history otherwise all reviewed and reported negative  Exam:  General exam: Alert, awake, oriented x 3 Respiratory system: Clear to auscultation. Respiratory effort normal. Cardiovascular system:RRR. No murmurs, rubs, gallops. Gastrointestinal system: Abdomen is nondistended, soft and nontender. No organomegaly or masses felt. Normal bowel sounds heard. Central nervous system: Alert and oriented. No focal neurological deficits. Extremities: No C/C/E, +pedal pulses Skin: No rashes, lesions or ulcers Psychiatry: Judgement and insight appear normal. Mood & affect appropriate.    Data Reviewed: Basic Metabolic Panel: Recent Labs  Lab 06/25/18 2212  NA 138  K 4.8  CL 103  CO2 26  GLUCOSE 127*  BUN 28*  CREATININE 0.92  CALCIUM 9.0   Liver Function Tests: Recent Labs  Lab 06/25/18 2212  AST 20  ALT 19  ALKPHOS 57  BILITOT 0.2*  PROT 7.1  ALBUMIN 3.7   No results for input(s): LIPASE, AMYLASE in the last 168 hours. No results for input(s): AMMONIA in the last 168 hours. CBC: Recent Labs  Lab 06/25/18 2220 06/26/18 0419 06/27/18 0436 06/27/18 1629 06/28/18 0443  WBC 12.7* 11.3* 7.8  --  9.2  HGB 13.4 10.7* 9.9* 10.0* 9.7*  HCT  43.2 34.4* 31.3* 32.1* 30.8*  MCV 98.2 98.6 97.5  --  97.8  PLT 318 270 247  --  246   Cardiac Enzymes: No results for input(s): CKTOTAL, CKMB, CKMBINDEX, TROPONINI in the last 168 hours. CBG (last 3)  No results for input(s): GLUCAP in the last 72 hours. No results found for this or any previous visit (from the past 240 hour(s)).   Studies: No results found.  Scheduled Meds: . feeding supplement  1 Container Oral TID BM  . pantoprazole  40 mg Oral QAC  breakfast   Continuous Infusions: . sodium chloride 20 mL/hr at 06/28/18 1646   Principal Problem:   Melena Active Problems:   Hematochezia   Chronic lower back pain   HLD (hyperlipidemia)   GERD (gastroesophageal reflux disease)   Acute GI bleeding  Time spent:  Erick Blinks, MD [facility] 06/28/2018, 6:51 PM    LOS: 0 days

## 2018-06-28 NOTE — Plan of Care (Signed)
  Problem: Acute Rehab PT Goals(only PT should resolve) Goal: Patient Will Transfer Sit To/From Stand Flowsheets (Taken 06/28/2018 [phone]) Patient will transfer sit to/from stand: Independently Goal: Pt Will Transfer Bed To Chair/Chair To Bed Flowsheets (Taken 06/28/2018 0940) Pt will Transfer Bed to Chair/Chair to Bed: Independently Goal: Pt Will Ambulate Flowsheets (Taken 06/28/2018 0922) Pt will Ambulate: > 125 feet; with modified independence; with least restrictive assistive device    Jac Canavan PT, DPT

## 2018-06-28 NOTE — H&P (View-Only) (Signed)
Subjective:  Passing small amount of blood, improves each day. No abdominal pain. Tolerating diet.   Objective: Vital signs in last 24 hours: Temp:  [98.2 F (36.8 C)-98.7 F (37.1 C)] 98.7 F (37.1 C) (03/26 0518) Pulse Rate:  [75-88] 75 (03/26 0518) Resp:  [16-20] 16 (03/26 0518) BP: (116-120)/(56-62) 116/62 (03/26 0518) SpO2:  [95 %] 95 % (03/26 0518) Last BM Date: 06/27/18 General:   Alert,  Well-developed, well-nourished, pleasant and cooperative in NAD Head:  Normocephalic and atraumatic. Eyes:  Sclera clear, no icterus.  Abdomen:  Soft, nontender and nondistended.Normal bowel sounds, without guarding, and without rebound.   Extremities:  Without clubbing, deformity or edema. Neurologic:  Alert and  oriented x4;  grossly normal neurologically. Psych:  Alert and cooperative. Normal mood and affect.  Intake/Output from previous day: 03/25 0701 - 03/26 0700 In: 1473.5 [P.O.:840; I.V.:633.5] Out: 1 [Stool:1] Intake/Output this shift: No intake/output data recorded.  Lab Results: CBC Recent Labs    06/26/18 0419 06/27/18 0436 06/27/18 1629 06/28/18 0443  WBC 11.3* 7.8  --  9.2  HGB 10.7* 9.9* 10.0* 9.7*  HCT 34.4* 31.3* 32.1* 30.8*  MCV 98.6 97.5  --  97.8  PLT 270 247  --  246   BMET Recent Labs    06/25/18 2212  NA 138  K 4.8  CL 103  CO2 26  GLUCOSE 127*  BUN 28*  CREATININE 0.92  CALCIUM 9.0   LFTs Recent Labs    06/25/18 2212  BILITOT 0.2*  ALKPHOS 57  AST 20  ALT 19  PROT 7.1  ALBUMIN 3.7   No results for input(s): LIPASE in the last 72 hours. PT/INR Recent Labs    06/25/18 2212  LABPROT 12.6  INR 1.0      Imaging Studies: No results found.[2 weeks]   Assessment: 77 y/o female with h/o diverticular bleed in 2017, presented with dark burgundy, black stools, acute blood loss anemia. Intermittent use of Mobic, ASA without PPI. EGD showed low-grade narrowing Schatzki ring. No source identified to explain maroon stools/melena and she  is suspected to have diverticular bleed possibly right colon.   Hemoglobin has been stable over the last 24 hours. Bleeding tapered off, "just a little". Per nursing, BM this morning was type 6 and no reported blood. Last night she had some black/red stool with clots. Discussed with Dr. Fields. Patient has already ate breakfast. Will plan on EGD with Givens placement Friday (given pill dysphagia and known Schatzki ring will plan on capsule placement).   Plan: 1. Hold ASA/NSAIDS indefinitely unless medically necessary.  2. Return to Dr.Hung in four months.  3. EGD with Givens capsule placement Friday. Start clear liquids now. NPO after 8pm except meds.  I have discussed the risks, alternatives, benefits with regards to but not limited to the risk of reaction to medication, bleeding, infection, perforation and the patient is agreeable to proceed. Written consent to be obtained. 4. Discussed plan with patient's daughter, [name]. She agrees with plan and reports patient is able to make decision for herself.    Stephanie Castelli S. Smayan Hackbart, PA-C [facility] [phone] 3/26/20208:51 AM     LOS: 0 days     

## 2018-06-28 NOTE — Progress Notes (Signed)
Subjective:  Passing small amount of blood, improves each day. No abdominal pain. Tolerating diet.   Objective: Vital signs in last 24 hours: Temp:  [98.2 F (36.8 C)-98.7 F (37.1 C)] 98.7 F (37.1 C) (03/26 0518) Pulse Rate:  [75-88] 75 (03/26 0518) Resp:  [16-20] 16 (03/26 0518) BP: (116-120)/(56-62) 116/62 (03/26 0518) SpO2:  [95 %] 95 % (03/26 0518) Last BM Date: 06/27/18 General:   Alert,  Well-developed, well-nourished, pleasant and cooperative in NAD Head:  Normocephalic and atraumatic. Eyes:  Sclera clear, no icterus.  Abdomen:  Soft, nontender and nondistended.Normal bowel sounds, without guarding, and without rebound.   Extremities:  Without clubbing, deformity or edema. Neurologic:  Alert and  oriented x4;  grossly normal neurologically. Psych:  Alert and cooperative. Normal mood and affect.  Intake/Output from previous day: 03/25 0701 - 03/26 0700 In: 1473.5 [P.O.:840; I.V.:633.5] Out: 1 [Stool:1] Intake/Output this shift: No intake/output data recorded.  Lab Results: CBC Recent Labs    06/26/18 0419 06/27/18 0436 06/27/18 1629 06/28/18 0443  WBC 11.3* 7.8  --  9.2  HGB 10.7* 9.9* 10.0* 9.7*  HCT 34.4* 31.3* 32.1* 30.8*  MCV 98.6 97.5  --  97.8  PLT 270 247  --  246   BMET Recent Labs    06/25/18 2212  NA 138  K 4.8  CL 103  CO2 26  GLUCOSE 127*  BUN 28*  CREATININE 0.92  CALCIUM 9.0   LFTs Recent Labs    06/25/18 2212  BILITOT 0.2*  ALKPHOS 57  AST 20  ALT 19  PROT 7.1  ALBUMIN 3.7   No results for input(s): LIPASE in the last 72 hours. PT/INR Recent Labs    06/25/18 2212  LABPROT 12.6  INR 1.0      Imaging Studies: No results found.[2 weeks]   Assessment: 79 y/o female with h/o diverticular bleed in 2017, presented with dark burgundy, black stools, acute blood loss anemia. Intermittent use of Mobic, ASA without PPI. EGD showed low-grade narrowing Schatzki ring. No source identified to explain maroon stools/melena and she  is suspected to have diverticular bleed possibly right colon.   Hemoglobin has been stable over the last 24 hours. Bleeding tapered off, "just a little". Per nursing, BM this morning was type 6 and no reported blood. Last night she had some black/red stool with clots. Discussed with Dr. Darrick Penna. Patient has already ate breakfast. Will plan on EGD with Givens placement Friday (given pill dysphagia and known Schatzki ring will plan on capsule placement).   Plan: 1. Hold ASA/NSAIDS indefinitely unless medically necessary.  2. Return to Dr.Hung in four months.  3. EGD with Givens capsule placement Friday. Start clear liquids now. NPO after 8pm except meds.  I have discussed the risks, alternatives, benefits with regards to but not limited to the risk of reaction to medication, bleeding, infection, perforation and the patient is agreeable to proceed. Written consent to be obtained. 4. Discussed plan with patient's daughter, Angelique Blonder. She agrees with plan and reports patient is able to make decision for herself.    Leanna Battles. [facility] [phone] 3/26/20208:51 AM     LOS: 0 days

## 2018-06-28 NOTE — TOC Initial Note (Signed)
Transition of Care [facility]) - Initial/Assessment Note    Patient Details  Name: Stephanie Ingram MRN: [id number] Date of Birth: 03/10/1941  [facility]) CM/SW Contact:    Vinessa Macconnell, Chrystine Oiler, RN Phone Number: 06/28/2018, 11:56 AM  Clinical Narrative:     Patient from home, son lives with her. Independent, no assistive devices. Still drives, has PCP. Seen by PT, recommended for OP PT. Patient declines, does not feel she needs these services. She walked 150 ft supervised only.      Expected Discharge Plan: Home/Self Care Barriers to Discharge: No Barriers Identified   Patient Goals and CMS Choice Patient states their goals for this hospitalization and ongoing recovery are:: to get back to her home and be safe      Expected Discharge Plan and Services Expected Discharge Plan: Home/Self Care   Discharge Planning Services: CM Consult   Living arrangements for the past 2 months: Single Family Home                          Prior Living Arrangements/Services Living arrangements for the past 2 months: Single Family Home Lives with:: Adult Children Patient language and need for interpreter reviewed:: No Do you feel safe going back to the place where you live?: Yes      Need for Family Participation in Patient Care: No (Comment) Care giver support system in place?: No (comment)   Criminal Activity/Legal Involvement Pertinent to Current Situation/Hospitalization: No - Comment as needed  Activities of Daily Living Home Assistive Devices/Equipment: Dentures (specify type) ADL Screening (condition at time of admission) Patient's cognitive ability adequate to safely complete daily activities?: Yes Is the patient deaf or have difficulty hearing?: No Does the patient have difficulty seeing, even when wearing glasses/contacts?: No Does the patient have difficulty concentrating, remembering, or making decisions?: No Patient able to express need for assistance with  ADLs?: Yes Does the patient have difficulty dressing or bathing?: No Independently performs ADLs?: Yes (appropriate for developmental age) Does the patient have difficulty walking or climbing stairs?: No Weakness of Legs: None Weakness of Arms/Hands: None  Permission Sought/Granted   Permission granted to share information with : Yes, Verbal Permission Granted              Emotional Assessment Appearance:: Appears stated age, Well-Groomed Attitude/Demeanor/Rapport: Engaged Affect (typically observed): Accepting, Calm Orientation: : Oriented to Self, Oriented to Place, Oriented to  Time, Oriented to Situation   Psych Involvement: No (comment)  Admission diagnosis:  -- Patient Active Problem List   Diagnosis Date Noted  . Acute GI bleeding   . Melena 06/25/2018  . GERD (gastroesophageal reflux disease) 06/25/2018  . S/P lumbar discectomy 06/29/2015  . Lower GI bleed 05/07/2015  . Hematochezia 05/07/2015  . Chronic lower back pain 05/07/2015  . HLD (hyperlipidemia) 05/07/2015  . HNP (herniated nucleus pulposus), lumbar 09/11/2013  . UTI (urinary tract infection) 09/11/2013   PCP:  Barbie Banner, MD Pharmacy:   [facility] [address] [place] - [id number] [place] [address] Phone: [phone] Fax: [phone]

## 2018-06-29 ENCOUNTER — Encounter (HOSPITAL_COMMUNITY): Payer: Self-pay | Admitting: *Deleted

## 2018-06-29 ENCOUNTER — Encounter (HOSPITAL_COMMUNITY)
Admission: EM | Disposition: A | Discharge status: Home / Self Care | Payer: Self-pay | Source: Home / Self Care | Attending: Internal Medicine

## 2018-06-29 HISTORY — PX: GIVENS CAPSULE STUDY: SHX5432

## 2018-06-29 HISTORY — PX: ESOPHAGOGASTRODUODENOSCOPY: SHX5428

## 2018-06-29 SURGERY — EGD (ESOPHAGOGASTRODUODENOSCOPY)
Anesthesia: Moderate Sedation

## 2018-06-29 MED ORDER — MIDAZOLAM HCL 5 MG/5ML IJ SOLN
INTRAMUSCULAR | Status: DC | PRN
  Administered 2018-06-29: 2 mg via INTRAVENOUS

## 2018-06-29 MED ORDER — ONDANSETRON HCL 4 MG/2ML IJ SOLN
INTRAMUSCULAR | Status: DC | PRN
  Administered 2018-06-29: 4 mg via INTRAVENOUS

## 2018-06-29 MED ORDER — LIDOCAINE VISCOUS HCL 2 % MT SOLN
OROMUCOSAL | Status: AC
  Filled 2018-06-29: qty 15

## 2018-06-29 MED ORDER — LIDOCAINE VISCOUS HCL 2 % MT SOLN
OROMUCOSAL | Status: DC | PRN
  Administered 2018-06-29: 1 via OROMUCOSAL

## 2018-06-29 MED ORDER — MEPERIDINE HCL 100 MG/ML IJ SOLN
INTRAMUSCULAR | Status: DC | PRN
  Administered 2018-06-29: 25 mg via INTRAVENOUS

## 2018-06-29 MED ORDER — MIDAZOLAM HCL 5 MG/5ML IJ SOLN
INTRAMUSCULAR | Status: AC
  Filled 2018-06-29: qty 10

## 2018-06-29 MED ORDER — ONDANSETRON HCL 4 MG/2ML IJ SOLN
INTRAMUSCULAR | Status: AC
  Filled 2018-06-29: qty 2

## 2018-06-29 MED ORDER — MEPERIDINE HCL 100 MG/ML IJ SOLN
INTRAMUSCULAR | Status: AC
  Filled 2018-06-29: qty 2

## 2018-06-29 MED ORDER — STERILE WATER FOR IRRIGATION IR SOLN
Status: DC | PRN
  Administered 2018-06-29: 1.5 mL

## 2018-06-29 NOTE — Interval H&P Note (Signed)
History and Physical Interval Note:  06/29/2018 8:34 AM  Stephanie Ingram  has presented today for surgery, with the diagnosis of melena, anemia, obscure gi bleeding.  The various methods of treatment have been discussed with the patient and family. After consideration of risks, benefits and other options for treatment, the patient has consented to  Procedure(s) with comments: ESOPHAGOGASTRODUODENOSCOPY (EGD) (N/A) - EGD for capsule placement, patient has difficulty swallowing large pills and known schatzki ring GIVENS CAPSULE STUDY (N/A) as a surgical intervention.  The patient's history has been reviewed, patient examined, no change in status, stable for surgery.  I have reviewed the patient's chart and labs.  Questions were answered to the patient's satisfaction.    DISCUSSED PROCEDURE, BENEFITS, & RISKS: < 1% chance of medication reaction, PERFORATION, CAPSULE RETENTION REQUIRING SURGERY TO RETRIEVE IT, OR bleeding.   [employer]

## 2018-06-29 NOTE — Progress Notes (Signed)
Physical Therapy Treatment Patient Details Name: Stephanie Ingram MRN: [id number] DOB: 12-31-1940 Today's Date: 06/29/2018    History of Present Illness Stephanie Ingram is a 78 y.o. female with medical history significant of Osteoarthritis, chronic back pain, GERD, history of lower GI bleed, shingles, hyperlipidemia, osteoporosis, pneumonia, short-term memory loss, urinary frequency who is coming to the emergency department after having rectal bleeding followed by lightheadedness and a syncopal episode while in the toilet today.  She states that initially the bleeding was maroon in color and now is dark red.  She also states that her stools lately have been looking darker than usual.  She denies abdominal pain, nausea, emesis, constipation, dysuria or hematuria.  Chest pain, dyspnea, palpitations, diaphoresis, PND, orthopnea or current pitting edema lower extremities.  Denies fever, chills, sore throat, rhinorrhea, wheezing or hemoptysis.  Denies polyuria, polydipsia, polyphagia or blurred vision.    PT Comments    Pt sitting in chair upon therapist entrance, very friendly and willing to participate with therapy today.  Pt able to complete transfer training independently with no assistance required.   Increased distance with gait training to 230 ft, no LOB episodes noted and good cadence.  Pt stated she was happily surprised with no pain following gait.  EOS pt left in chair with call bell within reach.  Was limited by fatigue with increased distance.  Due to good stability this session, trial with [facility] next session.   Follow Up Recommendations  Outpatient PT     Equipment Recommendations  None recommended by PT    Recommendations for Other Services       Precautions / Restrictions Precautions Precautions: None Precaution Comments: only needs assistance to manage IV pole Restrictions Weight Bearing Restrictions: No    Mobility  Bed Mobility                   Transfers Overall transfer level: Independent Equipment used: None                Ambulation/Gait Ambulation/Gait assistance: Supervision Gait Distance (Feet): 230 Feet Assistive device: Rolling walker (2 wheeled) Gait Pattern/deviations: Step-through pattern;Decreased stride length;Antalgic     General Gait Details: Increased distance with good cadence and speed, able to complete 230 ft with RW no LOB episodes or c/o pain.     Stairs             Wheelchair Mobility    Modified Rankin (Stroke Patients Only)       Balance                                            Cognition Arousal/Alertness: Awake/alert Behavior During Therapy: WFL for tasks assessed/performed Overall Cognitive Status: Within Functional Limits for tasks assessed                                        Exercises      General Comments        Pertinent Vitals/Pain Pain Assessment: No/denies pain    Home Living Family/patient expects to be discharged to:: Private residence Living Arrangements: Children                  Prior Function            PT Goals (current  goals can now be found in the care plan section) Acute Rehab PT Goals Patient Stated Goal: get stronger and go home Progress towards PT goals: Progressing toward goals(Begin gait training with [facility] next session)    Frequency    Min 3X/week      PT Plan Current plan remains appropriate    Co-evaluation              AM-PAC PT "6 Clicks" Mobility   Outcome Measure  Help needed turning from your back to your side while in a flat bed without using bedrails?: None Help needed moving from lying on your back to sitting on the side of a flat bed without using bedrails?: None Help needed moving to and from a bed to a chair (including a wheelchair)?: None Help needed standing up from a chair using your arms (e.g., wheelchair or bedside chair)?: None Help needed to walk  in hospital room?: A Little Help needed climbing 3-5 steps with a railing? : A Little 6 Click Score: 22    End of Session Equipment Utilized During Treatment: Gait belt Activity Tolerance: Patient tolerated treatment well Patient left: in chair;with call bell/phone within reach Nurse Communication: Mobility status(RN aware of status) PT Visit Diagnosis: Muscle weakness (generalized) (M62.81);Difficulty in walking, not elsewhere classified (R26.2);Other abnormalities of gait and mobility (R26.89)     Time: 1030-1050 PT Time Calculation (min) (ACUTE ONLY): 20 min  Charges:  $Gait Training: 8-22 mins $Therapeutic Activity: 8-22 mins                     Becky Sax, [place]; CBIS [phone]'  Juel Burrow 06/29/2018, 10:54 AM

## 2018-06-29 NOTE — Procedures (Addendum)
INDICATION: PRESENTED TO ED WITH MAROON STOOLS & RARE BLACK STOOL/BRBPR ON ASA/MOBIC   PATIENT DATA: WEIGHT: 75.7 KG     GASTRIC PASSAGE TIME: 1 m, SB PASSAGE TIME: 5H 16m  RESULTS: LIMITED views of gastric mucosa due to retained contents BECAUSE CAPSULE PLACED VIA EGD. AVM SEEN AT 49:24:34. ULCERS SEEN  @4 :46:50(98% CAPSULE PROGRESS).  LIMITED VIEWS OF THE COLON DUE TO RETAINED CONTENTS. No old blood or fresh blood in the stomach, SMALL BOWEL, OR COLON.   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 NO ACTIVE BLEEDING ON GIVENS.  Plan: 1. OK TO D/C HOME. 2. NO ASA/NSAIDs UNLESS MEDICALLY NECESSARY. 3. FOLLOW UP CBC IN 3 MOS WITH DR. HUNG OR DR. Haroon Shatto.  1845: ATTEMPTED TO DISCUSS WITH DAUGHTER BUT THE PHONE WAS BUSY.

## 2018-06-29 NOTE — Progress Notes (Signed)
PROGRESS NOTE  Stephanie Ingram  PNP:[id number]  DOB: Apr 18, 1940  DOA: 06/25/2018 PCP: Barbie Banner, MD  Brief Admission Hx: 78 y.o. female with medical history significant of osteoarthritis, chronic back pain, GERD, history of lower GI bleed, shingles, hyperlipidemia, osteoporosis, pneumonia, short-term memory loss, urinary frequency who is coming to the emergency department after having rectal bleeding followed by lightheadedness and a syncopal episode while on the toilet.   MDM/Assessment & Plan:   1. Melena - Pt has been started on supportive care and started on IV Protonix infusion.  Monitoring hemoglobin.  Transfuse as needed.  GI consult requested.  EGD showed low-grade narrowing Schatzki ring.  No source identified for melena.  She underwent EGD on 3/27 with capsule placement.  Capsule endoscopy did not show any clear source of bleeding in the small bowel or colon.  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  Further NSAIDs/aspirin should be discontinued. 2. Acute blood loss anemia-secondary to GI bleeding, hemoglobin stable at 9.7.   We will continue to monitor.  3. Chronic lower back pain- analgesics as ordered. 4. GERD- continue daily Protonix. 5. Vasovagal syncope - pt passed out while on toilet, symptoms likely exacerbated by GI bleeding.  Fall precautions.  Seen by PT with recommendations for outpatient PT.   DVT prophylaxis: SCDs Code Status: Full code Family Communication: Patient, no family at bedside. Disposition Plan: Home when medically stabilized  Consultants:  GI   Procedures:    Antimicrobials:     Subjective: No further nausea or vomiting.  No abdominal pain.  She is sleepy after undergoing endoscopy.  Objective: Vitals:   06/29/18 0855 06/29/18 0900 06/29/18 0905 06/29/18 1505  BP: (!) 124/48 (!) 121/57 (!) 106/49 132/77  Pulse: 90 93 79 87  Resp: 13 16 12 18   Temp:    98.2 F (36.8 C)  TempSrc:    Oral  SpO2: 98%  100% 100% 98%  Weight:      Height:        Intake/Output Summary (Last 24 hours) at 06/29/2018 1839 Last data filed at 06/29/2018 0900 Gross per 24 hour  Intake 1218.5 ml  Output -  Net 1218.5 ml   Filed Weights   06/25/18 2159 06/25/18 2341  Weight: 71.7 kg 75.7 kg   REVIEW OF SYSTEMS  As per history otherwise all reviewed and reported negative  Exam:  General exam: Alert, awake, oriented x 3 Respiratory system: Clear to auscultation. Respiratory effort normal. Cardiovascular system:RRR. No murmurs, rubs, gallops. Gastrointestinal system: Abdomen is nondistended, soft and nontender. No organomegaly or masses felt. Normal bowel sounds heard. Central nervous system: Alert and oriented. No focal neurological deficits. Extremities: No C/C/E, +pedal pulses Skin: No rashes, lesions or ulcers Psychiatry: Judgement and insight appear normal. Mood & affect appropriate.      Data Reviewed: Basic Metabolic Panel: Recent Labs  Lab 06/25/18 2212  NA 138  K 4.8  CL 103  CO2 26  GLUCOSE 127*  BUN 28*  CREATININE 0.92  CALCIUM 9.0   Liver Function Tests: Recent Labs  Lab 06/25/18 2212  AST 20  ALT 19  ALKPHOS 57  BILITOT 0.2*  PROT 7.1  ALBUMIN 3.7   No results for input(s): LIPASE, AMYLASE in the last 168 hours. No results for input(s): AMMONIA in the last 168 hours. CBC: Recent Labs  Lab 06/25/18 2220 06/26/18 0419 06/27/18 0436 06/27/18 1629 06/28/18 0443  WBC 12.7* 11.3* 7.8  --  9.2  HGB 13.4  10.7* 9.9* 10.0* 9.7*  HCT 43.2 34.4* 31.3* 32.1* 30.8*  MCV 98.2 98.6 97.5  --  97.8  PLT 318 270 247  --  246   Cardiac Enzymes: No results for input(s): CKTOTAL, CKMB, CKMBINDEX, TROPONINI in the last 168 hours. CBG (last 3)  No results for input(s): GLUCAP in the last 72 hours. No results found for this or any previous visit (from the past 240 hour(s)).   Studies: No results found.  Scheduled Meds: . feeding supplement  1 Container Oral TID BM  .  pantoprazole  40 mg Oral QAC breakfast   Continuous Infusions:  Principal Problem:   Melena Active Problems:   Hematochezia   Chronic lower back pain   HLD (hyperlipidemia)   GERD (gastroesophageal reflux disease)   Acute GI bleeding  Time spent:  Erick Blinks, MD [facility] 06/29/2018, 6:39 PM    LOS: 1 day

## 2018-06-29 NOTE — Op Note (Signed)
[facility] Patient Name: Stephanie Ingram Procedure Date: 06/29/2018 8:24 AM MRN: [id number] Date of Birth: January 09, 1941 Attending MD: Jonette Eva MD, MD CSN: [id number] Age: 78 Admit Type: Outpatient Procedure:                Upper GI endoscopy WITH GIVENS CAPSULE PLACEMENT Indications:              Hematochezia, Melena, Gastrointestinal bleeding of                            unknown origin, Anemia Providers:                Jonette Eva MD, MD, Buel Ream. Windell Hummingbird, RN,                            Pandora Leiter, Technician, Burke Keels,                            Technician Referring MD:             Gloriajean Dell. Wilson Medicines:                Ondansetron 4 mg IV, Meperidine 50 mg IV, Midazolam                            4 mg IV Complications:            No immediate complications. Estimated Blood Loss:     Estimated blood loss: none. Procedure:                Pre-Anesthesia Assessment:                           - Prior to the procedure, a History and Physical                            was performed, and patient medications and                            allergies were reviewed. The patient's tolerance of                            previous anesthesia was also reviewed. The risks                            and benefits of the procedure and the sedation                            options and risks were discussed with the patient.                            All questions were answered, and informed consent                            was obtained. Prior Anticoagulants: The patient has  taken no previous anticoagulant or antiplatelet                            agents except for aspirin. ASA Grade Assessment: II                            - A patient with mild systemic disease. After                            reviewing the risks and benefits, the patient was                            deemed in satisfactory condition to undergo the    procedure. After obtaining informed consent, the                            endoscope was passed under direct vision.                            Throughout the procedure, the patient's blood                            pressure, pulse, and oxygen saturations were                            monitored continuously. The GIF-[id number] ([id number])                            scope was introduced through the mouth, and                            advanced to the second part of duodenum. The upper                            GI endoscopy was accomplished without difficulty.                            The patient tolerated the procedure well. Scope In: 8:53:01 AM Scope Out: 9:01:52 AM Total Procedure Duration: 0 hours 8 minutes 51 seconds  Findings:      A low-grade of narrowing Schatzki ring was found at the gastroesophageal       junction.      Diffuse mild inflammation characterized by congestion (edema) and       erythema was found in the entire examined stomach.      The examined duodenum was normal. Impression:               - Low-grade of narrowing Schatzki ring.                           - MILD Gastritis.                           - GIVENS CAPSULE RELEASED IN DUODENAL BULB Moderate Sedation:      Moderate (conscious) sedation was administered by the endoscopy nurse  and supervised by the endoscopist. The following parameters were       monitored: oxygen saturation, heart rate, blood pressure, and response       to care. Total physician intraservice time was 21 minutes. Recommendation:           - Return patient to hospital ward for ongoing care.                           - NPO for 2 hours. CLEAR LIQUIDS AFTER 1100.                           - Continue present medications. WILL READ GIVENS                            CAPSULE THIS EVENING. CBC TODAY.                           - Return to my office in 4 months. Procedure Code(s):        --- Professional ---                           [phone],  Esophagogastroduodenoscopy, flexible,                            transoral; diagnostic, including collection of                            specimen(s) by brushing or washing, when performed                            (separate procedure)                           G0500, Moderate sedation services provided by the                            same physician or other qualified health care                            professional performing a gastrointestinal                            endoscopic service that sedation supports,                            requiring the presence of an independent trained                            observer to assist in the monitoring of the                            patient's level of consciousness and physiological                            status; initial 15 minutes of intra-service time;  patient age 35 years or older (additional time may                            be reported with [id number], as appropriate) Diagnosis Code(s):        --- Professional ---                           K22.2, Esophageal obstruction                           K29.70, Gastritis, unspecified, without bleeding                           K92.1, Melena (includes Hematochezia)                           K92.2, Gastrointestinal hemorrhage, unspecified                           D64.9, Anemia, unspecified CPT copyright 2019 [employer]. All rights reserved. The codes documented in this report are preliminary and upon coder review may  be revised to meet current compliance requirements. Jonette Eva, MD Jonette Eva MD, MD 06/29/2018 9:27:38 AM This report has been signed electronically. Number of Addenda: 0

## 2018-06-30 DIAGNOSIS — E785 Hyperlipidemia, unspecified: Secondary | ICD-10-CM

## 2018-06-30 MED ORDER — PANTOPRAZOLE SODIUM 40 MG PO TBEC: 40.0000 mg | DELAYED_RELEASE_TABLET | Freq: Every day | ORAL | 0 refills | Status: DC

## 2018-06-30 NOTE — Discharge Summary (Signed)
Physician Discharge Summary  Stephanie Ingram:[id number] DOB: Jun 11, 1940 DOA: 06/25/2018  PCP: Barbie Banner, MD  Admit date: 06/25/2018 Discharge date: 06/30/2018  Admitted From: home Disposition:  home  Recommendations for Outpatient Follow-up:  1. Follow up with PCP in 1-2 weeks 2. Please obtain BMP/CBC in one week 3. Follow up with GI as an outpatient  Home Health:  Equipment/Devices:  Discharge Condition:stable CODE STATUS:full code Diet recommendation: heart healthy  Brief/Interim Summary: 78 y.o.femalewith medical history significant ofosteoarthritis, chronic back pain, GERD, history of lower GI bleed, shingles, hyperlipidemia, osteoporosis, pneumonia, short-term memory loss, urinary frequency who is coming to the emergency department after having rectal bleeding followed by lightheadedness and a syncopal episode while on the toilet.   Discharge Diagnoses:  Principal Problem:   Melena Active Problems:   Hematochezia   Chronic lower back pain   HLD (hyperlipidemia)   GERD (gastroesophageal reflux disease)   Acute GI bleeding  1. Melena -Patient was treated with intravenous protonix.  Hemoglobin was monitored.  Transfuse as needed.  GI was consulted.  EGD showed low-grade narrowing Schatzki ring.  No source identified for melena.  She underwent repeat EGD on 3/27 with capsule placement.  Capsule endoscopy did not show any clear source of bleeding in the small bowel or colon.  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  Further NSAIDs/aspirin should be discontinued. 2. Acute blood loss anemia-secondary to GI bleeding, hemoglobin stable at 9.7.    3. Chronic lower back pain- analgesics as ordered. She was advised to discontinue further NSAIDs 4. GERD- continue daily Protonix. 5. Vasovagal syncope - pt passed out while on toilet, symptoms likely exacerbated by GI bleeding.  Fall precautions.  Seen by PT with recommendations for outpatient PT. No  further recurrence in the hospital.  Discharge Instructions  Discharge Instructions    Diet - low sodium heart healthy   Complete by:  As directed    Increase activity slowly   Complete by:  As directed      Allergies as of 06/30/2018      Reactions   Nitrofurantoin Shortness Of Breath   Denosumab Swelling, Hives   Fosamax [alendronate Sodium] Swelling   Sulfamethoxazole-trimethoprim Other (See Comments)   Unspecified: patient does not remember this allergy-this was entered via [facility] records      Medication List    STOP taking these medications   aspirin EC 81 MG tablet   meloxicam 15 MG tablet Commonly known as:  MOBIC     TAKE these medications   acetaminophen 500 MG tablet Commonly known as:  TYLENOL Take 500 mg by mouth every 6 (six) hours as needed for mild pain or moderate pain.   calcium-vitamin D 500-200 MG-UNIT tablet Commonly known as:  OSCAL WITH D Take 2 tablets by mouth daily.   cetirizine 10 MG tablet Commonly known as:  ZYRTEC Take 10 mg by mouth daily as needed for allergies.   estradiol 0.1 MG/GM vaginal cream Commonly known as:  ESTRACE Place 1 Applicatorful vaginally 3 (three) times a week.   guaiFENesin 100 MG/5ML Soln Commonly known as:  ROBITUSSIN Take 5 mLs by mouth every 4 (four) hours as needed for cough or to loosen phlegm.   multivitamin with minerals Tabs tablet Take 1 tablet by mouth daily.   pantoprazole 40 MG tablet Commonly known as:  PROTONIX Take 1 tablet (40 mg total) by mouth daily before breakfast. Start taking on:  July 01, 2018  Allergies  Allergen Reactions  . Nitrofurantoin Shortness Of Breath  . Denosumab Swelling and Hives  . Fosamax [Alendronate Sodium] Swelling  . Sulfamethoxazole-Trimethoprim Other (See Comments)    Unspecified: patient does not remember this allergy-this was entered via [facility] records    Consultations:  GI   Procedures/Studies:  No results  found.    Subjective: No abdominal pain. No dark stools  Discharge Exam: Vitals:   06/29/18 0905 06/29/18 1505 06/29/18 2139 06/30/18 0526  BP: (!) 106/49 132/77 (!) 124/56 132/72  Pulse: 79 87 89 78  Resp: 12 18 20 20   Temp:  98.2 F (36.8 C) 98.2 F (36.8 C) 98.1 F (36.7 C)  TempSrc:  Oral Axillary Oral  SpO2: 100% 98% 98% 97%  Weight:      Height:        General: Pt is alert, awake, not in acute distress Cardiovascular: RRR, S1/S2 +, no rubs, no gallops Respiratory: CTA bilaterally, no wheezing, no rhonchi Abdominal: Soft, NT, ND, bowel sounds + Extremities: no edema, no cyanosis    The results of significant diagnostics from this hospitalization (including imaging, microbiology, ancillary and laboratory) are listed below for reference.     Microbiology: No results found for this or any previous visit (from the past 240 hour(s)).   Labs: BNP (last 3 results) No results for input(s): BNP in the last 8760 hours. Basic Metabolic Panel: Recent Labs  Lab 06/25/18 2212  NA 138  K 4.8  CL 103  CO2 26  GLUCOSE 127*  BUN 28*  CREATININE 0.92  CALCIUM 9.0   Liver Function Tests: Recent Labs  Lab 06/25/18 2212  AST 20  ALT 19  ALKPHOS 57  BILITOT 0.2*  PROT 7.1  ALBUMIN 3.7   No results for input(s): LIPASE, AMYLASE in the last 168 hours. No results for input(s): AMMONIA in the last 168 hours. CBC: Recent Labs  Lab 06/25/18 2220 06/26/18 0419 06/27/18 0436 06/27/18 1629 06/28/18 0443  WBC 12.7* 11.3* 7.8  --  9.2  HGB 13.4 10.7* 9.9* 10.0* 9.7*  HCT 43.2 34.4* 31.3* 32.1* 30.8*  MCV 98.2 98.6 97.5  --  97.8  PLT 318 270 247  --  246   Cardiac Enzymes: No results for input(s): CKTOTAL, CKMB, CKMBINDEX, TROPONINI in the last 168 hours. BNP: Invalid input(s): POCBNP CBG: No results for input(s): GLUCAP in the last 168 hours. D-Dimer No results for input(s): DDIMER in the last 72 hours. Hgb A1c No results for input(s): HGBA1C in the last  72 hours. Lipid Profile No results for input(s): CHOL, HDL, LDLCALC, TRIG, CHOLHDL, LDLDIRECT in the last 72 hours. Thyroid function studies No results for input(s): TSH, T4TOTAL, T3FREE, THYROIDAB in the last 72 hours.  Invalid input(s): FREET3 Anemia work up No results for input(s): VITAMINB12, FOLATE, FERRITIN, TIBC, IRON, RETICCTPCT in the last 72 hours. Urinalysis    Component Value Date/Time   COLORURINE YELLOW 09/09/2013 1444   APPEARANCEUR CLOUDY (A) 09/09/2013 1444   LABSPEC 1.018 09/09/2013 1444   PHURINE 5.5 09/09/2013 1444   GLUCOSEU NEGATIVE 09/09/2013 1444   HGBUR MODERATE (A) 09/09/2013 1444   BILIRUBINUR NEGATIVE 09/09/2013 1444   KETONESUR NEGATIVE 09/09/2013 1444   PROTEINUR NEGATIVE 09/09/2013 1444   UROBILINOGEN 0.2 09/09/2013 1444   NITRITE POSITIVE (A) 09/09/2013 1444   LEUKOCYTESUR LARGE (A) 09/09/2013 1444   Sepsis Labs Invalid input(s): PROCALCITONIN,  WBC,  LACTICIDVEN Microbiology No results found for this or any previous visit (from the past 240 hour(s)).  Time coordinating discharge:  SIGNED:   Erick Blinks, MD  [facility] 06/30/2018, 7:30 PM   If 7PM-7AM, please contact night-coverage [email or web address]

## 2018-06-30 NOTE — Progress Notes (Signed)
IV removed by Ladona Ridgel, NT, patient tolerated well. Reviewed AVS with patient who verbalized understanding.  Patient transported to lobby via wheelchair and transported home by her daughter, Angelique Blonder.

## 2018-07-04 ENCOUNTER — Encounter (HOSPITAL_COMMUNITY): Payer: Self-pay | Admitting: Gastroenterology

## 2020-04-28 ENCOUNTER — Encounter (HOSPITAL_BASED_OUTPATIENT_CLINIC_OR_DEPARTMENT_OTHER): Payer: Self-pay | Admitting: *Deleted

## 2020-04-28 ENCOUNTER — Other Ambulatory Visit: Payer: Self-pay

## 2020-04-28 DIAGNOSIS — R220 Localized swelling, mass and lump, head: Secondary | ICD-10-CM | POA: Insufficient documentation

## 2020-04-28 DIAGNOSIS — Z5321 Procedure and treatment not carried out due to patient leaving prior to being seen by health care provider: Secondary | ICD-10-CM | POA: Insufficient documentation

## 2020-04-28 NOTE — ED Triage Notes (Signed)
Swelling to the right side of her face tonight. She took Benadryl and Zyrtec. She feels the swelling has stopped and states she might go back home. No difficulty swallowing. No respiratory distress. She is ambulatory.

## 2020-04-29 ENCOUNTER — Emergency Department (HOSPITAL_BASED_OUTPATIENT_CLINIC_OR_DEPARTMENT_OTHER)
Admission: EM | Admit: 2020-04-29 | Discharge: 2020-04-29 | Disposition: A | Discharge status: Home / Self Care | Payer: Medicare HMO | Attending: Emergency Medicine | Admitting: Emergency Medicine

## 2021-06-23 ENCOUNTER — Other Ambulatory Visit: Payer: Self-pay

## 2021-06-23 ENCOUNTER — Encounter (HOSPITAL_COMMUNITY): Payer: Self-pay

## 2021-06-23 ENCOUNTER — Emergency Department (HOSPITAL_COMMUNITY)
Admission: EM | Admit: 2021-06-23 | Discharge: 2021-06-23 | Disposition: A | Discharge status: Home / Self Care | Payer: Medicare HMO | Attending: Emergency Medicine | Admitting: Emergency Medicine

## 2021-06-23 ENCOUNTER — Emergency Department (HOSPITAL_COMMUNITY): Payer: Medicare HMO

## 2021-06-23 DIAGNOSIS — I10 Essential (primary) hypertension: Secondary | ICD-10-CM | POA: Insufficient documentation

## 2021-06-23 DIAGNOSIS — R350 Frequency of micturition: Secondary | ICD-10-CM | POA: Diagnosis present

## 2021-06-23 DIAGNOSIS — R42 Dizziness and giddiness: Secondary | ICD-10-CM | POA: Diagnosis not present

## 2021-06-23 DIAGNOSIS — R739 Hyperglycemia, unspecified: Secondary | ICD-10-CM | POA: Insufficient documentation

## 2021-06-23 DIAGNOSIS — N3090 Cystitis, unspecified without hematuria: Secondary | ICD-10-CM | POA: Diagnosis not present

## 2021-06-23 LAB — URINALYSIS, ROUTINE W REFLEX MICROSCOPIC
Bilirubin Urine: NEGATIVE
Glucose, UA: NEGATIVE mg/dL
Ketones, ur: 5 mg/dL — AB
Nitrite: POSITIVE — AB
Protein, ur: NEGATIVE mg/dL
Specific Gravity, Urine: 1.009 (ref 1.005–1.030)
WBC, UA: >50 WBC/hpf — ABNORMAL HIGH (ref 0–5)
pH: 5 (ref 5.0–8.0)

## 2021-06-23 LAB — BASIC METABOLIC PANEL
Anion gap: 9 (ref 5–15)
BUN: 14 mg/dL (ref 8–23)
CO2: 27 mmol/L (ref 22–32)
Calcium: 9.6 mg/dL (ref 8.9–10.3)
Chloride: 105 mmol/L (ref 98–111)
Creatinine, Ser: 0.79 mg/dL (ref 0.44–1.00)
GFR, Estimated: >60 mL/min (ref >60)
Glucose, Bld: 106 mg/dL — ABNORMAL HIGH (ref 70–99)
Potassium: 4.4 mmol/L (ref 3.5–5.1)
Sodium: 141 mmol/L (ref 135–145)

## 2021-06-23 LAB — CBC
HCT: 46.9 % — ABNORMAL HIGH (ref 36.0–46.0)
Hemoglobin: 14.9 g/dL (ref 12.0–15.0)
MCH: 31 pg (ref 26.0–34.0)
MCHC: 31.8 g/dL (ref 30.0–36.0)
MCV: 97.5 fL (ref 80.0–100.0)
Platelets: 231 10*3/uL (ref 150–400)
RBC: 4.81 MIL/uL (ref 3.87–5.11)
RDW: 12.9 % (ref 11.5–15.5)
WBC: 7.7 10*3/uL (ref 4.0–10.5)
nRBC: 0 % (ref 0.0–0.2)

## 2021-06-23 LAB — CBG MONITORING, ED: Glucose-Capillary: 91 mg/dL (ref 70–99)

## 2021-06-23 MED ORDER — CEPHALEXIN 500 MG PO CAPS: 500.0000 mg | ORAL_CAPSULE | Freq: Four times a day (QID) | ORAL | 0 refills | Status: DC

## 2021-06-23 MED ORDER — ACETAMINOPHEN 325 MG PO TABS
650.0000 mg | ORAL_TABLET | Freq: Once | ORAL | Status: AC
  Administered 2021-06-23: 650 mg via ORAL
  Filled 2021-06-23: qty 2

## 2021-06-23 MED ORDER — SODIUM CHLORIDE 0.9 % IV SOLN
1.0000 g | Freq: Once | INTRAVENOUS | Status: AC
  Administered 2021-06-23: 1 g via INTRAVENOUS
  Filled 2021-06-23: qty 10

## 2021-06-23 MED ORDER — SODIUM CHLORIDE 0.9 % IV BOLUS
500.0000 mL | Freq: Once | INTRAVENOUS | Status: AC
  Administered 2021-06-23: 500 mL via INTRAVENOUS

## 2021-06-23 MED ORDER — MORPHINE SULFATE (PF) 2 MG/ML IV SOLN
2.0000 mg | Freq: Once | INTRAVENOUS | Status: AC
  Administered 2021-06-23: 2 mg via INTRAVENOUS
  Filled 2021-06-23: qty 1

## 2021-06-23 NOTE — ED Provider Triage Note (Signed)
Emergency Medicine Provider Triage Evaluation Note ? ?Lindwood Coke , a 81 y.o. female  was evaluated in triage.  Pt complains of multiple complaints.  She states that she started having dizziness yesterday, and could not get into her doctor soon enough so she went urgent care today.  They noted her to have a blood pressure with systolics in the 190s, had concern for hypertensive urgency.  Patient was also complaining of chronic back pain.  On their evaluation they found that she had a urinary tract infection, and I sent her to the emergency department for evaluation of both her blood pressure as well as UTI versus pyelonephritis. ? ?Review of Systems  ?Positive: Back pain, headache, dizziness ?Negative: Falls, vision changes, weakness or numbness ? ?Physical Exam  ?BP (!) 169/91   Pulse 82   Temp 98 ?F (36.7 ?C) (Oral)   Resp 20   Ht 5\' 3"  (1.6 m)   Wt 69.4 kg   SpO2 94%   BMI 27.10 kg/m?  ?Gen:   Awake, no distress   ?Resp:  Normal effort  ?MSK:   Moves extremities without difficulty  ?Other:   ? ?Medical Decision Making  ?Medically screening exam initiated at 12:12 PM.  Appropriate orders placed.  Aubrei ABREANNA DRAWDY was informed that the remainder of the evaluation will be completed by another provider, this initial triage assessment does not replace that evaluation, and the importance of remaining in the ED until their evaluation is complete. ? ? ?  ?Haig Gerardo T, PA-C ?06/23/21 1213 ? ?

## 2021-06-23 NOTE — ED Notes (Signed)
Discharge instructions, follow up care, and prescriptions reviewed and explained. Pt verbalized understanding. 

## 2021-06-23 NOTE — ED Triage Notes (Signed)
Pt sent from urgent care for UTI or possible kidney infection. Pt has been c/o of back pain,headache and dizziness since yesterday.  ?

## 2021-06-23 NOTE — ED Provider Notes (Signed)
?[facility] EMERGENCY DEPARTMENT ?Provider Note ? ? ?CSN: [id number] ?Arrival date & time: 06/23/21  1122 ? ?  ? ?History ? ?Chief Complaint  ?Patient presents with  ? Hypertension  ? Dizziness  ? ? ?Stephanie Ingram is a 81 y.o. 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  Patient reports that she has been urinating around 15 times a day, with dysuria.  She endorses that the pain is now radiating to right side of her flank.  She denies history of kidney stones.  She denies any chest pain, shortness of breath, nausea, vomiting, diarrhea.  She was seen in urgent care prior to arrival and sent to the emergency department for further evaluation due to hypertension with systolic of 191, tachycardia into the 120s, and some clinical fever of 99.  Patient denies feeling significant fever, chills. ? ? ?Hypertension ? ?Dizziness ? ?  ? ?Home Medications ?Prior to Admission medications   ?Medication Sig Start Date End Date Taking? Authorizing Provider  ?cephALEXin (KEFLEX) 500 MG capsule Take 1 capsule (500 mg total) by mouth 4 (four) times daily. 06/23/21  Yes Shadell Brenn H, PA-C  ?acetaminophen (TYLENOL) 500 MG tablet Take 500 mg by mouth every 6 (six) hours as needed for mild pain or moderate pain.    [provider]  ?calcium-vitamin D (OSCAL WITH D) 500-200 MG-UNIT tablet Take 2 tablets by mouth daily.    [provider]  ?cetirizine (ZYRTEC) 10 MG tablet Take 10 mg by mouth daily as needed for allergies.    [provider]  ?estradiol (ESTRACE) 0.1 MG/GM vaginal cream Place 1 Applicatorful vaginally 3 (three) times a week.    [provider]  ?guaiFENesin (ROBITUSSIN) 100 MG/5ML SOLN Take 5 mLs by mouth every 4 (four) hours as needed for cough or to loosen phlegm.     [provider]  ?Multiple Vitamin (MULTIVITAMIN WITH MINERALS) TABS tablet Take 1 tablet by mouth daily.    [provider]  ?pantoprazole (PROTONIX) 40 MG tablet Take 1 tablet (40 mg total) by mouth daily before breakfast. 07/01/18   Erick Blinks, MD  ?   ? ?Allergies    ?Nitrofurantoin, Denosumab, Fosamax [alendronate sodium], and Sulfamethoxazole-trimethoprim   ? ?Review of Systems   ?Review of Systems  ?Genitourinary:  Positive for dysuria and frequency.  ?Neurological:  Positive for dizziness.  ?All other systems reviewed and are negative. ? ?Physical Exam ?Updated Vital Signs ?BP (!) 157/86   Pulse 62   Temp 98 ?F (36.7 ?C) (Oral)   Resp 17   Ht 5\' 3"  (1.6 m)   Wt 69.4 kg   SpO2 98%   BMI 27.10 kg/m?  ?Physical Exam ?Vitals and nursing note reviewed.  ?Constitutional:   ?   General: She is not in acute distress. ?   Appearance: Normal appearance. She is not ill-appearing.  ?HENT:  ?   Head: Normocephalic and atraumatic.  ?Eyes:  ?   General:     ?   Right eye: No discharge.     ?   Left eye: No discharge.  ?Cardiovascular:  ?   Rate and Rhythm: Normal rate and regular rhythm.  ?   Heart sounds: No murmur heard. ?  No friction rub. No gallop.  ?Pulmonary:  ?   Effort: Pulmonary effort is  normal.  ?   Breath sounds: Normal breath sounds.  ?Abdominal:  ?   General: Bowel sounds are normal.  ?   Palpations: Abdomen is soft.  ?   Comments: Some tenderness to palpation in the suprapubic region, radiating to the right flank.  No CVA tenderness.   ?Musculoskeletal:  ?   Comments: No midline spinal tenderness, intact strength 5 out of 5 bilateral upper and lower extremities.  Patient can ambulate without difficulty.  ?Skin: ?   General: Skin is warm and dry.  ?   Capillary Refill: Capillary refill takes less than 2 seconds.  ?Neurological:  ?   Mental Status: She is alert and oriented to person, place, and time.  ?Psychiatric:     ?   Mood and Affect: Mood normal.     ?   Behavior: Behavior  normal.  ? ? ?ED Results / Procedures / Treatments   ?Labs ?(all labs ordered are listed, but only abnormal results are displayed) ?Labs Reviewed  ?BASIC METABOLIC PANEL - Abnormal; Notable for the following components:  ?    Result Value  ? Glucose, Bld 106 (*)   ? All other components within normal limits  ?CBC - Abnormal; Notable for the following components:  ? HCT 46.9 (*)   ? All other components within normal limits  ?URINALYSIS, ROUTINE W REFLEX MICROSCOPIC - Abnormal; Notable for the following components:  ? APPearance HAZY (*)   ? Hgb urine dipstick MODERATE (*)   ? Ketones, ur 5 (*)   ? Nitrite POSITIVE (*)   ? Leukocytes,Ua MODERATE (*)   ? WBC, UA >50 (*)   ? Bacteria, UA MANY (*)   ? All other components within normal limits  ?URINE CULTURE  ?CBG MONITORING, ED  ? ? ?EKG ?EKG Interpretation ? ?Date/Time:  Wednesday June 23 2021 11:55:56 EDT ?Ventricular Rate:  78 ?PR Interval:  160 ?QRS Duration: 68 ?QT Interval:  368 ?QTC Calculation: 419 ?R Axis:   54 ?Text Interpretation: Normal sinus rhythm Normal ECG When compared with ECG of 25-Jun-2018 22:19, PREVIOUS ECG IS PRESENT Confirmed by Alvester Chourifan, Matthew [phone]([id number]) on 06/23/2021 3:25:45 PM ? ?Radiology ?CT Head Wo Contrast ? ?Result Date: 06/23/2021 ?CLINICAL DATA:  Dizziness EXAM: CT HEAD WITHOUT CONTRAST TECHNIQUE: Contiguous axial images were obtained from the base of the skull through the vertex without intravenous contrast. RADIATION DOSE REDUCTION: This exam was performed according to the departmental dose-optimization program which includes automated exposure control, adjustment of the mA and/or kV according to patient size and/or use of iterative reconstruction technique. COMPARISON:  None. FINDINGS: Brain: No acute intracranial findings are seen in noncontrast CT brain. Calcifications are seen in the basal ganglia on both sides. Cortical sulci are prominent. There is decreased density in the periventricular white matter. Possible old lacunar infarcts  are seen in the basal ganglia. Vascular: Scattered arterial calcifications are seen. Skull: Unremarkable. Sinuses/Orbits: There is mild mucosal thickening in the ethmoid and maxillary sinuses. Possible small osteoma is seen in the left frontal sinus. Other: None IMPRESSION: No acute intracranial findings are seen in noncontrast CT brain. Atrophy. 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 Mild chronic sinusitis. Electronically Signed   By: Ernie AvenaPalani  Rathinasamy M.D.   On: 06/23/2021 13:11   ? ?Procedures ?Procedures  ? ? ?Medications Ordered in ED ?Medications  ?acetaminophen (TYLENOL) tablet 650 mg (650 mg Oral Given 06/23/21 1602)  ?cefTRIAXone (ROCEPHIN) 1 g in sodium chloride 0.9 % 100 mL IVPB (0 g  Intravenous Stopped 06/23/21 1629)  ?morphine (PF) 2 MG/ML injection 2 mg (2 mg Intravenous Given 06/23/21 1602)  ?sodium chloride 0.9 % bolus 500 mL (0 mLs Intravenous Stopped 06/23/21 1723)  ? ? ?ED Course/ Medical Decision Making/ A&P ?Clinical Course as of 06/23/21 1738  ?Wed Jun 23, 2021  ?1604 This is a well-appearing 81 year old female presenting to emergency department with dysuria for approximately 1 week, referred in from urgent care with concern for high blood pressure and possible urine infection.  She has had UTIs in the past.  I reviewed her external records including her most recent positive urine culture from December 2022, which grew E. coli that was [place].  Patient is here with her daughter at bedside.  The patient reports no other significant complaints, she has chronic lower back pain that is positional I suspect most likely musculoskeletal.  She does not have CVA tenderness, is afebrile, does not have a leukocytosis to suggest pyelonephritis or sepsis.  UA is consistent with infection.  We will give IV Rocephin here and discharged on antibiotics at home.  She does have some mild hypertension which I suspect may be reactive to her infection, have a low suspicion for  hypertensive emergency at this time.  I recommend that she follow-up with her PCP if her blood pressure remains elevated, but I anticipate the pressure would likely improve after her infection is adequately

## 2021-06-23 NOTE — ED Notes (Signed)
The patient stated that she felt dizzy and that her vision was fuzzy. The patient stated that she has felt dizzy sense yesterday. This EMT informed the Triage RN. ?

## 2021-06-23 NOTE — Discharge Instructions (Signed)
Please return if you have worsening pain with urination, develop severe fever, chills, nausea, vomiting. Please follow up with your PCP to ensure your symptoms are improving. ?

## 2021-06-25 LAB — URINE CULTURE: Culture: >=100000 — AB

## 2021-06-26 ENCOUNTER — Telehealth (HOSPITAL_BASED_OUTPATIENT_CLINIC_OR_DEPARTMENT_OTHER): Payer: Self-pay | Admitting: *Deleted

## 2021-06-26 NOTE — Telephone Encounter (Signed)
Post ED Visit - Positive Culture Follow-up ? ?Culture report reviewed by antimicrobial stewardship pharmacist: ?[facility] ?[]  , Enzo Bi.D. ?[]  [address], Pharm.D., BCPS AQ-ID ?[]  , Pharm.D., BCPS ?[]  Celedonio Miyamoto, Pharm.D., BCPS ?[]  [facility], Garvin Fila.D., BCPS, AAHIVP ?[]  , Pharm.D., BCPS, AAHIVP ?[]  Georgina Pillion, PharmD, BCPS ?[]  , PharmD, BCPS ?[]  [place], PharmD, BCPS ?[]  [address], PharmD ?[]  , PharmD, BCPS ?[x]  Estella Husk, PharmD ? ? [facility] ?[]  Lysle Pearl, PharmD ?[]  , PharmD ?[]  Phillips Climes, PharmD ?[]  , Rph ?[]  Agapito Games) , PharmD ?[]  Verlan Friends, PharmD ?[]  , PharmD ?[]  Mervyn Gay, PharmD ?[]  , PharmD ?[]  Riccardo Dubin, PharmD ?[]  Gerri Spore, PharmD ?[]  , PharmD ?[]  Len Childs, PharmD ? ? ?Positive urine culture ?Treated with Cephalexin, organism sensitive to the same and no further patient follow-up is required at this time. ? ? ?06/26/2021, 3:41 PM ?  ?

## 2022-02-23 ENCOUNTER — Emergency Department (HOSPITAL_COMMUNITY)
Admission: EM | Admit: 2022-02-23 | Discharge: 2022-02-23 | Disposition: A | Discharge status: Home / Self Care | Payer: Medicare HMO | Attending: Emergency Medicine | Admitting: Emergency Medicine

## 2022-02-23 ENCOUNTER — Emergency Department (HOSPITAL_COMMUNITY): Payer: Medicare HMO

## 2022-02-23 DIAGNOSIS — K5900 Constipation, unspecified: Secondary | ICD-10-CM

## 2022-02-23 DIAGNOSIS — K219 Gastro-esophageal reflux disease without esophagitis: Secondary | ICD-10-CM | POA: Diagnosis not present

## 2022-02-23 DIAGNOSIS — K5730 Diverticulosis of large intestine without perforation or abscess without bleeding: Secondary | ICD-10-CM | POA: Diagnosis not present

## 2022-02-23 DIAGNOSIS — K5792 Diverticulitis of intestine, part unspecified, without perforation or abscess without bleeding: Secondary | ICD-10-CM

## 2022-02-23 LAB — URINALYSIS, ROUTINE W REFLEX MICROSCOPIC
Bilirubin Urine: NEGATIVE
Glucose, UA: NEGATIVE mg/dL
Ketones, ur: NEGATIVE mg/dL
Leukocytes,Ua: NEGATIVE
Nitrite: NEGATIVE
Protein, ur: NEGATIVE mg/dL
Specific Gravity, Urine: 1.013 (ref 1.005–1.030)
pH: 7 (ref 5.0–8.0)

## 2022-02-23 LAB — COMPREHENSIVE METABOLIC PANEL
ALT: 17 U/L (ref 0–44)
AST: 23 U/L (ref 15–41)
Albumin: 3.7 g/dL (ref 3.5–5.0)
Alkaline Phosphatase: 41 U/L (ref 38–126)
Anion gap: 8 (ref 5–15)
BUN: 10 mg/dL (ref 8–23)
CO2: 27 mmol/L (ref 22–32)
Calcium: 8.9 mg/dL (ref 8.9–10.3)
Chloride: 104 mmol/L (ref 98–111)
Creatinine, Ser: 0.74 mg/dL (ref 0.44–1.00)
GFR, Estimated: >60 mL/min (ref >60)
Glucose, Bld: 112 mg/dL — ABNORMAL HIGH (ref 70–99)
Potassium: 3.6 mmol/L (ref 3.5–5.1)
Sodium: 139 mmol/L (ref 135–145)
Total Bilirubin: 0.3 mg/dL (ref 0.3–1.2)
Total Protein: 6.9 g/dL (ref 6.5–8.1)

## 2022-02-23 LAB — CBC
HCT: 46.2 % — ABNORMAL HIGH (ref 36.0–46.0)
Hemoglobin: 14.7 g/dL (ref 12.0–15.0)
MCH: 31.3 pg (ref 26.0–34.0)
MCHC: 31.8 g/dL (ref 30.0–36.0)
MCV: 98.5 fL (ref 80.0–100.0)
Platelets: 228 10*3/uL (ref 150–400)
RBC: 4.69 MIL/uL (ref 3.87–5.11)
RDW: 13.4 % (ref 11.5–15.5)
WBC: 4.4 10*3/uL (ref 4.0–10.5)
nRBC: 0 % (ref 0.0–0.2)

## 2022-02-23 LAB — POC OCCULT BLOOD, ED: Fecal Occult Bld: NEGATIVE

## 2022-02-23 LAB — LIPASE, BLOOD: Lipase: 37 U/L (ref 11–51)

## 2022-02-23 MED ORDER — IOHEXOL 350 MG/ML SOLN
75.0000 mL | Freq: Once | INTRAVENOUS | Status: AC | PRN
  Administered 2022-02-23: 75 mL via INTRAVENOUS

## 2022-02-23 MED ORDER — POLYETHYLENE GLYCOL 3350 17 G PO PACK: 17.0000 g | PACK | Freq: Every day | ORAL | 0 refills | Status: AC

## 2022-02-23 MED ORDER — AMOXICILLIN-POT CLAVULANATE 875-125 MG PO TABS
1.0000 | ORAL_TABLET | Freq: Once | ORAL | Status: AC
  Administered 2022-02-23: 1 via ORAL
  Filled 2022-02-23: qty 1

## 2022-02-23 MED ORDER — AMOXICILLIN-POT CLAVULANATE 875-125 MG PO TABS: 1.0000 | ORAL_TABLET | Freq: Two times a day (BID) | ORAL | 0 refills | Status: DC

## 2022-02-23 MED ORDER — SODIUM CHLORIDE 0.9 % IV BOLUS
1000.0000 mL | Freq: Once | INTRAVENOUS | Status: AC
Start: 2022-02-23 — End: 2022-02-23
  Administered 2022-02-23: 1000 mL via INTRAVENOUS

## 2022-02-23 NOTE — ED Triage Notes (Signed)
Patient here with complaint of constipation, patient states she has had difficulty having a bowel movement for the last eight weeks but this morning she feels pain when trying to have a bowel movement. Patient  is alert, oriented, and in no apparent distress at this time.

## 2022-02-23 NOTE — Discharge Instructions (Addendum)
Your evaluated in the emergency department today for constipation with abdominal pain.    Overall workup today showed evidence of diverticulitis without complications.  This is treated with antibiotics.  An antibiotic by the name of Augmentin has been sent to your pharmacy.  Take 1 capsule every 12 hours for the next 7 days.  Always take with plenty food and water.    Your abdominal pain today is likely due to the diverticulitis and the associated constipation.  You are also recommended to do a Miralax bowel cleanout for relief which includes 4 capfulls of Miralax in Gatorade.  Drink this in 2 hours on a day where you don't have plans and expect significant bowel movements throughout the day ending in liquid stools.  You may continue forward with 1 capfull of Miralax twice a day if difficulties with bowel movements continue.   Is important to follow-up with your PCP within the next 2 to 3 days for reevaluation and continued medical management.  You have also been provided the contact information for a local GI specialist.  Please follow-up with them within the next 1 week for reevaluation and cohesive management as well.  Return to the ED for any new or worsening symptoms as discussed.

## 2022-02-23 NOTE — ED Provider Notes (Signed)
[facility] EMERGENCY DEPARTMENT Provider Note   CSN: [id number] Arrival date & time: 02/23/22  0700     History  Chief Complaint  Patient presents with   Constipation    Stephanie Ingram is a 81 y.o. female with Hx of GERD, acute GI bleed, chronic lower back pain, and osteoporosis presenting to the ED today due to worsening constipation.  About 8 weeks ago had difficulty with bowel movements following an evening where she "became very sick", which she describes a large abnormal bowel movement.  Ever since then has had difficulty and pain with trying to pass stool.  Unsure whether she has seen blood in the stool.  Not on anticoagulation.  Does not take iron supplements and has not tried Pepto-Bismol.  Was recommended to try MiraLAX, tried this for 2 days before feeling lightheaded/dizzy and then stopped.  Then tried increasing her fluid intake, which did not help much either.  Over the last 1 to 2 weeks has noticed almost a 10 pound weight loss.  Denies significant abdominal pain, though does note some diffuse discomfort.  Patient became increasingly concerned over the last 2 to 3 days with episodes of nausea and nonbloody vomiting.  Denies fevers, neck stiffness, shortness of breath, or chest pain.  No active dizziness or lightheadedness.  Also notes she was informed by one of her doctors that she had blood in her urine, however does not know the cause of this yet.  Per record review, seen at UC due to several episodes of nausea and vomiting with 1 episode of diarrhea following eating a "taco salad" the night before.  At that point, believe this is what made her sick.  Was given Zofran and fluids, with reported improvement.  The history is provided by the patient and medical records.  Constipation      Home Medications Prior to Admission medications   Medication Sig Start Date End Date Taking? Authorizing Provider  amoxicillin-clavulanate (AUGMENTIN) 875-125 MG tablet  Take 1 tablet by mouth every 12 (twelve) hours. 02/23/22  Yes Cecil Cobbs, PA-C  polyethylene glycol (MIRALAX / GLYCOLAX) 17 g packet Take 17 g by mouth daily. 02/23/22  Yes Cecil Cobbs, PA-C  acetaminophen (TYLENOL) 500 MG tablet Take 500 mg by mouth every 6 (six) hours as needed for mild pain or moderate pain.    [provider]  calcium-vitamin D (OSCAL WITH D) 500-200 MG-UNIT tablet Take 2 tablets by mouth daily.    [provider]  cetirizine (ZYRTEC) 10 MG tablet Take 10 mg by mouth daily as needed for allergies.    [provider]  estradiol (ESTRACE) 0.1 MG/GM vaginal cream Place 1 Applicatorful vaginally 3 (three) times a week.    [provider]  guaiFENesin (ROBITUSSIN) 100 MG/5ML SOLN Take 5 mLs by mouth every 4 (four) hours as needed for cough or to loosen phlegm.    [provider]  Multiple Vitamin (MULTIVITAMIN WITH MINERALS) TABS tablet Take 1 tablet by mouth daily.    [provider]  pantoprazole (PROTONIX) 40 MG tablet Take 1 tablet (40 mg total) by mouth daily before breakfast. 07/01/18   Erick Blinks, MD      Allergies    Nitrofurantoin, Denosumab, Fosamax [alendronate sodium], and Sulfamethoxazole-trimethoprim    Review of Systems   Review of Systems  Gastrointestinal:  Positive for constipation.    Physical Exam Updated Vital Signs BP (!) 151/97   Pulse 83   Temp 97.8 F (36.6  C) (Oral)   Resp 19   Ht 5\' 4"  (1.626 m)   Wt 68 kg   SpO2 96%   BMI 25.75 kg/m  Physical Exam Vitals and nursing note reviewed. Exam conducted with a chaperone present.  Constitutional:      General: She is not in acute distress.    Appearance: She is well-developed. She is not ill-appearing, toxic-appearing or diaphoretic.  HENT:     Head: Normocephalic and atraumatic.     Mouth/Throat:     Mouth: Mucous membranes are dry.     Pharynx: Oropharynx is clear.  Eyes:     Conjunctiva/sclera: Conjunctivae  normal.  Cardiovascular:     Rate and Rhythm: Normal rate and regular rhythm.     Pulses: Normal pulses.     Heart sounds: No murmur heard. Pulmonary:     Effort: Pulmonary effort is normal. No respiratory distress.     Breath sounds: Normal breath sounds.  Abdominal:     General: Bowel sounds are normal. There is no distension.     Palpations: Abdomen is soft. There is no shifting dullness, mass or pulsatile mass.     Tenderness: There is abdominal tenderness (Diffuse, mild). There is no right CVA tenderness, left CVA tenderness or guarding. Negative signs include Murphy's sign and McBurney's sign.  Genitourinary:    Exam position: Knee-chest position.     Rectum: Normal. Guaiac result negative. No mass, tenderness, anal fissure or external hemorrhoid. Normal anal tone.     Comments: Mild-moderate stool noted in rectal vault. Musculoskeletal:        General: No swelling.     Cervical back: Neck supple.  Skin:    General: Skin is warm and dry.     Capillary Refill: Capillary refill takes less than 2 seconds.     Coloration: Skin is not jaundiced or pale.     Findings: No erythema.  Neurological:     Mental Status: She is alert and oriented to person, place, and time.  Psychiatric:        Mood and Affect: Mood normal.     ED Results / Procedures / Treatments   Labs (all labs ordered are listed, but only abnormal results are displayed) Labs Reviewed  CBC - Abnormal; Notable for the following components:      Result Value   HCT 46.2 (*)    All other components within normal limits  URINALYSIS, ROUTINE W REFLEX MICROSCOPIC - Abnormal; Notable for the following components:   Color, Urine STRAW (*)    Hgb urine dipstick SMALL (*)    Bacteria, UA RARE (*)    All other components within normal limits  COMPREHENSIVE METABOLIC PANEL - Abnormal; Notable for the following components:   Glucose, Bld 112 (*)    All other components within normal limits  LIPASE, BLOOD  POC OCCULT  BLOOD, ED    EKG None  Radiology CT Abdomen Pelvis W Contrast  Result Date: 02/23/2022 CLINICAL DATA:  Acute abdominal pain. EXAM: CT ABDOMEN AND PELVIS WITH CONTRAST TECHNIQUE: Multidetector CT imaging of the abdomen and pelvis was performed using the standard protocol following bolus administration of intravenous contrast. RADIATION DOSE REDUCTION: This exam was performed according to the departmental dose-optimization program which includes automated exposure control, adjustment of the mA and/or kV according to patient size and/or use of iterative reconstruction technique. CONTRAST:  14mL OMNIPAQUE IOHEXOL 350 MG/ML SOLN COMPARISON:  None Available. FINDINGS: Lower chest: No acute airspace disease or pleural effusion. Hepatobiliary: No  focal liver abnormality is seen. No gallstones, gallbladder wall thickening, or biliary dilatation. Pancreas: No peripancreatic inflammation. Proximal pancreatic duct is prominent measuring 5 mm. No obvious pancreatic mass. No pancreatic fluid collection. Spleen: Normal in size without focal abnormality. Adrenals/Urinary Tract: 15 mm left adrenal nodule with Hounsfield units of 49. 13 mm right adrenal nodule with Hounsfield units of 65. There is mild dilatation of both renal pelvises, likely extrarenal pelvis configuration. No evidence of obstruction, with symmetric excretion on delayed phase imaging and homogeneous renal enhancement. No renal calculi. No focal renal abnormality. Unremarkable urinary bladder. Stomach/Bowel: Tiny hiatal hernia. Occasional fluid within small bowel but no abnormal distension, wall thickening or inflammation. Normal appendix visualized. Multifocal colonic diverticulosis. There is mild fat stranding about diverticulum in the descending colon series 3, image 46, suspicious for diverticulitis. No perforation or abscess. There is moderate stool distending the rectum. Slight perirectal edema. Vascular/Lymphatic: Aortic atherosclerosis and  tortuosity. No aortic aneurysm. Patent portal vein. No abdominopelvic adenopathy. Reproductive: Uterus and bilateral adnexa are unremarkable. Other: No free air, free fluid, or intra-abdominal fluid collection. Small fat containing umbilical hernia. Musculoskeletal: Scoliosis and diffuse degenerative change in the spine. Degenerative change of the pubic symphysis IMPRESSION: 1. Mild acute uncomplicated diverticulitis of the descending colon. No perforation or abscess. 2. Moderate stool distending the rectum with mild perirectal edema, possible fecal impaction. 3. Small bilateral adrenal nodules, probable benign adenoma. Recommend follow-up adrenal washout CT in 1 year. If stable for 1 year, no further follow-up imaging. JACR 2017 Aug; 14(8):1038-44, JCAT 2016 Mar-Apr; 40(2):194-200, Urol J 2006 Spring; 3(2):71-4. 4. Prominent proximal pancreatic duct measuring 5 mm, of uncertain significance. No obvious pancreatic mass. Recommend correlation with pancreatic enzymes. If elevated, consider further evaluation with elective MRCP as an outpatient after resolution of acute event. Aortic Atherosclerosis (ICD10-I70.0). Electronically Signed   By: Narda Rutherford M.D.   On: 02/23/2022 17:59    Procedures Procedures    Medications Ordered in ED Medications  amoxicillin-clavulanate (AUGMENTIN) 875-125 MG per tablet 1 tablet (has no administration in time range)  sodium chloride 0.9 % bolus 1,000 mL (0 mLs Intravenous Stopped 02/23/22 1851)  iohexol (OMNIPAQUE) 350 MG/ML injection 75 mL (75 mLs Intravenous Contrast Given 02/23/22 1747)    ED Course/ Medical Decision Making/ A&P                           Medical Decision Making Amount and/or Complexity of Data Reviewed Labs: ordered.   81 y.o. female presents to the ED for concern of Constipation     This involves an extensive number of treatment options, and is a complaint that carries with it a high risk of complications and morbidity.  The emergent  differential diagnosis prior to evaluation includes, but is not limited to: Diverticulitis, appendicitis, acute cholecystitis, bowel perforation, bowel obstruction  This is not an exhaustive differential.   Past Medical History / Co-morbidities / Social History: Hx of GERD, acute GI bleed, chronic lower back pain, and osteoporosis Social Determinants of Health include: Elderly  Additional History:  Obtained by chart review.  Notably recent GI and UC visits, see for details.  Lab Tests: I ordered, and personally interpreted labs.  The pertinent results include:   WBC 4.4, without evidence of anemia No evidence of electrolyte derangement or AKI LFTs unremarkable Lipase 37 FOBT negative UA unremarkable  Imaging Studies: I ordered imaging studies including CT abdomen and pelvis.   I independently visualized and interpreted imaging  which showed  1. Mild acute uncomplicated diverticulitis of the descending colon. No perforation or abscess.  2. Moderate stool distending the rectum with mild perirectal edema, possible fecal impaction.  3. Small bilateral adrenal nodules, probable benign adenoma. Recommend follow-up adrenal washout CT in 1 year. If stable for 1 year, no further follow-up imaging. JACR 2017 Aug; 14(8):1038-44, JCAT 2016 Mar-Apr; 40(2):194-200, Urol J 2006 Spring; 3(2):71-4.  4. Prominent proximal pancreatic duct measuring 5 mm, of uncertain significance. No obvious pancreatic mass. Recommend correlation with pancreatic enzymes. If elevated, consider further evaluation with elective MRCP as an outpatient after resolution of acute event.  5. Aortic Atherosclerosis I agree with the radiologist interpretation.  ED Course / Critical Interventions: Pt overall well-appearing on exam.  Around 8 weeks of intermittent constipation following an episode of diarrhea/abnormal bowel movements.  Afebrile, nontoxic-appearing, nonseptic in no acute distress.  With some intermittent abdominal  pain.  Also with nausea and vomiting of the last 2 days.  Patient's pain and other symptoms adequately managed in ED.  Fluid bolus given.  Labs, history and vitals reviewed.  Without evidence of UTI.  WBC 4.4, no leukocytosis.  No evidence of acute hypovolemia or anemia.  Electrolytes unremarkable.  FOBT negative.  Patient does not meet the SIRS or Sepsis criteria.  Plan for CT imaging and reassess. On repeat exam patient does not have a surgical abdomen and there are no peritoneal signs.  Mild-moderate improvement appreciated.  Imaging indicates acute diverticulitis with mild-moderate stool burden, however no evidence of complication.  Incidental finding of pancreatic duct widening, nonspecific, and lipase of 37.  Do not believe further imaging then CT is recommended at this time.  Again has remained hemodynamically stable.  Recommend antibiotic therapy, stool softener, and close follow up with PCP/GI for re-evaluation and continued management.  GI resources provided.  First dose of Augmentin provided in ED, remaining sent to pharmacy.  Continued conservative symptomatic management discussed.  Strict return precautions discussed at length.  Patient in NAD and good condition at time of discharge.  Patient's son present, received patient's verbal authorization to discuss medical information with him at bedside.  Disposition: After consideration the patient's encounter today, I do not feel today's workup suggests an emergent condition requiring admission or immediate intervention beyond what has been performed at this time.  Safe for discharge; instructed to return immediately for worsening symptoms, change in symptoms or any other concerns.  I have reviewed the patients home medicines and have made adjustments as needed.  Discussed course of treatment with the patient, whom demonstrated understanding.  Patient in agreement and has no further questions.    I discussed this case with my attending, Dr. Rush Landmark,  who agreed with the proposed treatment course and cosigned this note including patient's presenting symptoms, physical exam, and planned diagnostics and interventions.  Attending physician stated agreement with plan or made changes to plan which were implemented.     This chart was dictated using voice recognition software.  Despite best efforts to proofread, errors can occur which can change the documentation meaning.         Final Clinical Impression(s) / ED Diagnoses Final diagnoses:  Diverticulitis  Constipation, unspecified constipation type    Rx / DC Orders ED Discharge Orders          Ordered    amoxicillin-clavulanate (AUGMENTIN) 875-125 MG tablet  Every 12 hours        02/23/22 1933    polyethylene glycol (MIRALAX / GLYCOLAX) 17 g  packet  Daily        02/23/22 1933              Sandrea HammondCockerham, Alireza Pollack M, PA-C 02/23/22 1958    Tegeler, Canary Brimhristopher J, MD 02/24/22 [phone]

## 2022-02-23 NOTE — ED Notes (Signed)
Patient transported to CT 

## 2022-02-23 NOTE — ED Notes (Signed)
Informed triage nurse Chestine Spore, RN) pt states she had a dizzy spell and felt real lighted headed.

## 2022-07-31 ENCOUNTER — Emergency Department (HOSPITAL_COMMUNITY)
Admission: EM | Admit: 2022-07-31 | Discharge: 2022-07-31 | Disposition: A | Discharge status: Home / Self Care | Payer: Medicare HMO | Attending: Emergency Medicine | Admitting: Emergency Medicine

## 2022-07-31 ENCOUNTER — Emergency Department (HOSPITAL_COMMUNITY): Payer: Medicare HMO

## 2022-07-31 ENCOUNTER — Other Ambulatory Visit: Payer: Self-pay

## 2022-07-31 ENCOUNTER — Encounter (HOSPITAL_COMMUNITY): Payer: Self-pay

## 2022-07-31 DIAGNOSIS — R5383 Other fatigue: Secondary | ICD-10-CM | POA: Diagnosis not present

## 2022-07-31 DIAGNOSIS — R0989 Other specified symptoms and signs involving the circulatory and respiratory systems: Secondary | ICD-10-CM | POA: Diagnosis not present

## 2022-07-31 DIAGNOSIS — R002 Palpitations: Secondary | ICD-10-CM | POA: Diagnosis present

## 2022-07-31 DIAGNOSIS — R42 Dizziness and giddiness: Secondary | ICD-10-CM | POA: Insufficient documentation

## 2022-07-31 LAB — URINALYSIS, ROUTINE W REFLEX MICROSCOPIC
Bacteria, UA: NONE SEEN
Bilirubin Urine: NEGATIVE
Glucose, UA: NEGATIVE mg/dL
Ketones, ur: NEGATIVE mg/dL
Leukocytes,Ua: NEGATIVE
Nitrite: NEGATIVE
Protein, ur: NEGATIVE mg/dL
Specific Gravity, Urine: 1.003 — ABNORMAL LOW (ref 1.005–1.030)
pH: 6 (ref 5.0–8.0)

## 2022-07-31 LAB — BASIC METABOLIC PANEL
Anion gap: 10 (ref 5–15)
BUN: 12 mg/dL (ref 8–23)
CO2: 23 mmol/L (ref 22–32)
Calcium: 9 mg/dL (ref 8.9–10.3)
Chloride: 103 mmol/L (ref 98–111)
Creatinine, Ser: 0.8 mg/dL (ref 0.44–1.00)
GFR, Estimated: >60 mL/min (ref >60)
Glucose, Bld: 104 mg/dL — ABNORMAL HIGH (ref 70–99)
Potassium: 4.2 mmol/L (ref 3.5–5.1)
Sodium: 136 mmol/L (ref 135–145)

## 2022-07-31 LAB — CBC
HCT: 42.2 % (ref 36.0–46.0)
Hemoglobin: 14 g/dL (ref 12.0–15.0)
MCH: 30.8 pg (ref 26.0–34.0)
MCHC: 33.2 g/dL (ref 30.0–36.0)
MCV: 92.7 fL (ref 80.0–100.0)
Platelets: 200 10*3/uL (ref 150–400)
RBC: 4.55 MIL/uL (ref 3.87–5.11)
RDW: 12.8 % (ref 11.5–15.5)
WBC: 6.7 10*3/uL (ref 4.0–10.5)
nRBC: 0 % (ref 0.0–0.2)

## 2022-07-31 LAB — TSH: TSH: 1.14 u[IU]/mL (ref 0.350–4.500)

## 2022-07-31 LAB — HEPATIC FUNCTION PANEL
ALT: 12 U/L (ref 0–44)
AST: 19 U/L (ref 15–41)
Albumin: 3.6 g/dL (ref 3.5–5.0)
Alkaline Phosphatase: 42 U/L (ref 38–126)
Bilirubin, Direct: 0.1 mg/dL (ref 0.0–0.2)
Indirect Bilirubin: 0.5 mg/dL (ref 0.3–0.9)
Total Bilirubin: 0.6 mg/dL (ref 0.3–1.2)
Total Protein: 6.7 g/dL (ref 6.5–8.1)

## 2022-07-31 LAB — TROPONIN I (HIGH SENSITIVITY)
Troponin I (High Sensitivity): 4 ng/L (ref <18)
Troponin I (High Sensitivity): 4 ng/L (ref <18)

## 2022-07-31 LAB — BRAIN NATRIURETIC PEPTIDE: B Natriuretic Peptide: 47.2 pg/mL (ref 0.0–100.0)

## 2022-07-31 LAB — MAGNESIUM: Magnesium: 1.8 mg/dL (ref 1.7–2.4)

## 2022-07-31 NOTE — ED Notes (Signed)
Patient transported to X-ray 

## 2022-07-31 NOTE — Discharge Instructions (Signed)
Your history, exam, and workup today were overall reassuring.  I suspect you are having symptomatic palpitations that you were feeling but the workup here did not show other concerning findings.  There was no sustained arrhythmia and your heart enzymes were normal.  Your heart failure test was normal.  Your electrolytes were overall reassuring.  I spoke with cardiology who felt you are safe for discharge home.  Please follow-up with your cardiology team and primary team.  If any symptoms change or worsen acutely, please return to the nearest emergency department.  Please rest and stay hydrated.

## 2022-07-31 NOTE — ED Triage Notes (Signed)
PT arrived POV with a family member with c/o heart palpitations. Started a week ago when her endoscopy was cancelled due to heart palpitations.Intermittent. No SOB or CP but slight dizziness when walking.Marland KitchenRecently had a heart monitor from [facility] and it was taken off yesterday.

## 2022-07-31 NOTE — ED Notes (Signed)
AVS reviewed with pt and family prior to discharge. Pt verbalizes understanding. Belongings with pt upon depart. Ambulatory to POV with family. VSS.

## 2022-07-31 NOTE — ED Provider Notes (Signed)
[facility]MENT AT [facility] Provider Note   CSN: [id number] Arrival date & time: 07/31/22  4098     History  Chief Complaint  Patient presents with   Palpitations    Stephanie Ingram is a 82 y.o. female.  The history is provided by the patient and medical records. No language interpreter was used.  Palpitations Palpitations quality:  Irregular Onset quality:  Gradual Duration:  2 weeks Timing:  Intermittent Progression:  Waxing and waning Chronicity:  New Relieved by:  Nothing Worsened by:  Nothing Ineffective treatments:  None tried Associated symptoms: near-syncope   Associated symptoms: no back pain, no chest pain, no chest pressure, no cough, no diaphoresis, no dizziness, no lower extremity edema, no nausea, no shortness of breath and no vomiting   Risk factors: no hx of PE        Home Medications Prior to Admission medications   Medication Sig Start Date End Date Taking? Authorizing Provider  acetaminophen (TYLENOL) 500 MG tablet Take 500 mg by mouth every 6 (six) hours as needed for mild pain or moderate pain.    [provider]  amoxicillin-clavulanate (AUGMENTIN) 875-125 MG tablet Take 1 tablet by mouth every 12 (twelve) hours. 02/23/22   Cecil Cobbs, PA-C  calcium-vitamin D (OSCAL WITH D) 500-200 MG-UNIT tablet Take 2 tablets by mouth daily.    [provider]  cetirizine (ZYRTEC) 10 MG tablet Take 10 mg by mouth daily as needed for allergies.    [provider]  estradiol (ESTRACE) 0.1 MG/GM vaginal cream Place 1 Applicatorful vaginally 3 (three) times a week.    [provider]  guaiFENesin (ROBITUSSIN) 100 MG/5ML SOLN Take 5 mLs by mouth every 4 (four) hours as needed for cough or to loosen phlegm.    [provider]  Multiple Vitamin (MULTIVITAMIN WITH MINERALS) TABS tablet Take 1 tablet by mouth daily.    [provider]  pantoprazole (PROTONIX) 40 MG tablet Take 1  tablet (40 mg total) by mouth daily before breakfast. 07/01/18   Erick Blinks, MD  polyethylene glycol (MIRALAX / GLYCOLAX) 17 g packet Take 17 g by mouth daily. 02/23/22   Cecil Cobbs, PA-C      Allergies    Nitrofurantoin, Denosumab, Fosamax [alendronate sodium], and Sulfamethoxazole-trimethoprim    Review of Systems   Review of Systems  Constitutional:  Positive for fatigue. Negative for chills, diaphoresis and fever.  HENT:  Negative for congestion.   Respiratory:  Negative for cough, chest tightness, shortness of breath and wheezing.   Cardiovascular:  Positive for palpitations and near-syncope. Negative for chest pain and leg swelling.  Gastrointestinal:  Negative for abdominal pain, constipation, diarrhea, nausea and vomiting.  Genitourinary:  Negative for dysuria.  Musculoskeletal:  Negative for back pain, neck pain and neck stiffness.  Skin:  Negative for rash and wound.  Neurological:  Positive for light-headedness. Negative for dizziness and headaches.  Psychiatric/Behavioral:  Negative for agitation and confusion.   All other systems reviewed and are negative.   Physical Exam Updated Vital Signs BP 135/74   Pulse 75   Temp 97.7 F (36.5 C) (Oral)   Resp 20   Ht 5\' 4"  (1.626 m)   Wt 68 kg   SpO2 98%   BMI 25.73 kg/m  Physical Exam Vitals and nursing note reviewed.  Constitutional:      General: She is not in acute distress.    Appearance: She is well-developed. She is not ill-appearing, toxic-appearing  or diaphoretic.  HENT:     Head: Normocephalic and atraumatic.     Nose: No congestion or rhinorrhea.     Mouth/Throat:     Mouth: Mucous membranes are moist.     Pharynx: No oropharyngeal exudate or posterior oropharyngeal erythema.  Eyes:     Extraocular Movements: Extraocular movements intact.     Conjunctiva/sclera: Conjunctivae normal.  Cardiovascular:     Rate and Rhythm: Normal rate and regular rhythm. Extrasystoles are present.    Heart  sounds: No murmur heard. Pulmonary:     Effort: Pulmonary effort is normal. No respiratory distress.     Breath sounds: Rales present. No wheezing or rhonchi.  Chest:     Chest wall: No tenderness.  Abdominal:     General: Abdomen is flat.     Palpations: Abdomen is soft.     Tenderness: There is no abdominal tenderness. There is no guarding or rebound.  Musculoskeletal:        General: No swelling or tenderness.     Cervical back: Neck supple. No tenderness.     Right lower leg: No edema.     Left lower leg: No edema.  Skin:    General: Skin is warm and dry.     Capillary Refill: Capillary refill takes less than 2 seconds.     Findings: No erythema or rash.  Neurological:     General: No focal deficit present.     Mental Status: She is alert.     Sensory: No sensory deficit.     Motor: No weakness.  Psychiatric:        Mood and Affect: Mood normal.     ED Results / Procedures / Treatments   Labs (all labs ordered are listed, but only abnormal results are displayed) Labs Reviewed  BASIC METABOLIC PANEL - Abnormal; Notable for the following components:      Result Value   Glucose, Bld 104 (*)    All other components within normal limits  URINALYSIS, ROUTINE W REFLEX MICROSCOPIC - Abnormal; Notable for the following components:   Color, Urine STRAW (*)    Specific Gravity, Urine 1.003 (*)    Hgb urine dipstick SMALL (*)    All other components within normal limits  URINE CULTURE  CBC  HEPATIC FUNCTION PANEL  BRAIN NATRIURETIC PEPTIDE  TSH  MAGNESIUM  TROPONIN I (HIGH SENSITIVITY)  TROPONIN I (HIGH SENSITIVITY)    EKG EKG Interpretation  Date/Time:  Sunday July 31 2022 10:09:18 EDT Ventricular Rate:  80 PR Interval:  165 QRS Duration: 82 QT Interval:  362 QTC Calculation: 418 R Axis:   59 Text Interpretation: Sinus rhythm Borderline low voltage, extremity leads when compared to prior, overall similar appearance. No STEMI Confirmed by Theda Belfast ([id number])  on 07/31/2022 10:10:33 AM  Radiology DG Chest 2 View  Result Date: 07/31/2022 CLINICAL DATA:  Palpitations, chest pain EXAM: CHEST - 2 VIEW COMPARISON:  06/24/2015 FINDINGS: Unchanged cardiac and mediastinal contours when accounting for low lung volumes. Bibasilar opacities, likely atelectasis. Prominent left epicardial fat pad. No pleural effusion or pneumothorax. No acute osseous abnormality. Degenerative changes in the shoulders bilaterally. IMPRESSION: Low lung volumes with bibasilar atelectasis. Electronically Signed   By: Wiliam Ke M.D.   On: 07/31/2022 11:05    Procedures Procedures    Medications Ordered in ED Medications - No data to display  ED Course/ Medical Decision Making/ A&P  Medical Decision Making Amount and/or Complexity of Data Reviewed Labs: ordered. Radiology: ordered.    Stephanie Ingram is a 82 y.o. female with a past medical history significant for chronic back pain with previous back surgery, hyperlipidemia, previous GI bleed, and history of urinary tract infection who presents with 2 weeks of intermittent palpitations, exertional fatigue, lightheadedness, and recent dysuria.  According to patient, so was because she had some dysuria and took some over-the-counter medication that seem to help her symptoms.  She reports she has not had further dysuria but over the last 2 weeks has had intermittent episodes of palpitations.  She reports she went to several different facilities and had an EKG that at 1 point told her she had A-fib.  She was lost to follow-up with a PCP and she just mailed in a heart monitor yesterday to try to get some results.  She reports she still having intermittent palpitations and feels very fatigued and lightheaded.  She reports she has no energy especially with exertion.  She denies focal chest pain or shortness of breath but she feels that something is wrong.  She reports she has had near syncope and  lightheadedness but has not actually passed out.  Otherwise she denies fevers, chills, congestion, cough, nausea, vomiting, constipation, or diarrhea.  She denies any other recent medication changes and has no history of other cardiac disease to her knowledge.  On exam, nontender.  Abdomen nontender.  She did have some PVCs on the monitor.  I did not appreciate any atrial fibrillation on the monitor initially.  She has no murmur.  Good pulses in extremities.  Legs were not edematous.  She did have some faint rales in the base of her lungs but otherwise exam was reassuring.  No focal neurologic deficits.  EKG does not show STEMI.  Clinically I am concerned patient may be having either symptomatic PVCs or intermittent atrial fibrillation.  She was otherwise well-appearing but due to her rales on exam we will get a BNP, x-ray, and labs.  We will monitor her.  Will get workup and then have a shared decision-making conversation about workup.  Anticipate reassessment.  Patient reports that since arriving she has had no further symptoms.  Her initial workup is starting to look reassuring.  Her urinalysis did not show evidence of UTI and her CBC is reassuring.  BNP not elevated and hepatic function is normal.  Metabolic panel showed no significant potassium abnormality and her initial troponin is negative.  Her BNP is normal and her magnesium is normal.  TSH is normal.  Will get second troponin.  Chest x-ray showed low lung volume but no pneumonia or other abnormality.  EKG did not show STEMI.  She did have PVCs on the monitor and when I reviewed her telemetry tracings, she did have numerous PVCs and had 1 episode of 3 beat ventricular tachycardia.  I still clinically suspect she is having symptomatic PVCs and occasional brief VT episodes.  Will touch base with cardiology to ensure close follow-up and make sure they do not think she needs admission for further management.  1:36 PM Spoke to cardiology who  reviewed the EKG and case.  They feel that given her stability for over 3 hours, her reassuring labs, and well appearance we feel she is safe for discharge home.  They feel she can follow-up in her clinic and follow-up with the provider who ordered the recent monitor.  Anticipate discharge shortly.  Delta troponin was negative.  Patient still feels well.  We went over all the findings and she will follow-up with her cardiology and PCP teams.  She understood return precautions and follow-up instructions and was discharged in good condition after reassuring workup and monitoring for over 5 hours without symptoms.        Final Clinical Impression(s) / ED Diagnoses Final diagnoses:  Palpitations  Fatigue, unspecified type  Episodic lightheadedness    Rx / DC Orders ED Discharge Orders     None       Clinical Impression: 1. Palpitations   2. Fatigue, unspecified type   3. Episodic lightheadedness     Disposition: Discharge  Condition: Good  I have discussed the results, Dx and Tx plan with the pt(& family if present). He/she/they expressed understanding and agree(s) with the plan. Discharge instructions discussed at great length. Strict return precautions discussed and pt &/or family have verbalized understanding of the instructions. No further questions at time of discharge.    New Prescriptions   No medications on file    Follow Up: Mattie Marlin, DO [address] [place] [address] [phone]     [facility] A DEPT OF [place]. [facility] [address] [place] [address] [phone]        Lanina Larranaga, Canary Brim, MD 07/31/22 [phone]

## 2022-08-01 LAB — URINE CULTURE: Culture: NO GROWTH

## 2022-09-07 ENCOUNTER — Ambulatory Visit: Payer: Medicare HMO | Attending: Internal Medicine | Admitting: Internal Medicine

## 2022-09-07 ENCOUNTER — Encounter: Payer: Self-pay | Admitting: Internal Medicine

## 2022-09-07 VITALS — BP 132/80 | HR 70 | Ht 65.0 in | Wt 148.8 lb

## 2022-09-07 DIAGNOSIS — I4891 Unspecified atrial fibrillation: Secondary | ICD-10-CM

## 2022-09-07 NOTE — Patient Instructions (Signed)
Medication Instructions:  The current medical regimen is effective;  continue present plan and medications.  *If you need a refill on your cardiac medications before your next appointment, please call your pharmacy*   Testing/Procedures: Echocardiogram - Your physician has requested that you have an echocardiogram. Echocardiography is a painless test that uses sound waves to create images of your heart. It provides your doctor with information about the size and shape of your heart and how well your heart's chambers and valves are working. This procedure takes approximately one hour. There are no restrictions for this procedure.     Follow-Up: At [facility], you and your health needs are our priority.  As part of our continuing mission to provide you with exceptional heart care, we have created designated Provider Care Teams.  These Care Teams include your primary Cardiologist (physician) and Advanced Practice Providers (APPs -  Physician Assistants and Nurse Practitioners) who all work together to provide you with the care you need, when you need it.  We recommend signing up for the patient portal called "MyChart".  Sign up information is provided on this After Visit Summary.  MyChart is used to connect with patients for Virtual Visits (Telemedicine).  Patients are able to view lab/test results, encounter notes, upcoming appointments, etc.  Non-urgent messages can be sent to your provider as well.   To learn more about what you can do with MyChart, go to [email or web address].    Your next appointment:   6 month(s)  Provider:   Carolan Clines, MD

## 2022-09-07 NOTE — Progress Notes (Signed)
Cardiology Office Note:    Date:  09/07/2022   ID:  Stephanie Ingram, DOB 1940-06-15, MRN [id number]  PCP:  Mattie Marlin, DO   [facility] Providers Cardiologist:  None     Referring MD: Mattie Marlin, DO   No chief complaint on file. Atrial Fibrillation  History of Present Illness:    Stephanie Ingram is a 82 y.o. female with a hx of GERD, HLD, referral for afib. She's been evaluated at [facility] in family medicine. She was started on metop XL 12.5 mg daily and xarelto 20 mg daily.   Per their documentation: " Patient went back to the emergency room on 07/31/2022 with heart palpitations and dysuria with fatigue and lightheadedness. On the heart monitor emergency room patient was noted to have PVCs but no atrial fibrillation. Patient's labs showed negative troponins, normal BMP, negative urine culture/urinalysis, normal CBC, stable BMP, normal TSH. Patient was then discharged home.  It was noted that the patient's heart monitor showed predominantly normal sinus rhythm with average heart rate of 76 and low heart rate of 52 with a maximum heart rate of 178. It was noted that patient did have atrial fibrillation/flutter occurring 5% of the time with heart rate ranging from 72-178 with the longest lasting approximately 40 minutes with average heart rate of 102. "  Today she is in sinus rhythm. No palpitations. No bleeding currently. She had diverticulitis in Feb.  She was planned for GI work up but was found to be in afib so it was canceled. Hgb 07/31/2022 was 14. No cardiac dx hx. No recent syncope. No LE edema, no orthopnea or PND. No thyroid dx. No stroke. No DM2. No OSA   09/07/2022- NSR, no ischemic changes  Past Medical History:  Diagnosis Date   Arthritis    Chronic back pain    HNP   GERD (gastroesophageal reflux disease)    occasionally but doesn't take any meds   GI bleeding    History of shingles    Hyperlipidemia    takes Tricor daily no longer takes   Joint  pain    Osteoporosis    Pneumonia 10+yrs ago   hx of   PONV (postoperative nausea and vomiting)    Short-term memory loss    Shortness of breath dyspnea    with exertion   Urinary frequency     Past Surgical History:  Procedure Laterality Date   COLONOSCOPY     COLONOSCOPY N/A 02/03/2017   Dr. Elnoria Howard: pancolonic diverticulosis   ESOPHAGOGASTRODUODENOSCOPY N/A 06/29/2018   Procedure: ESOPHAGOGASTRODUODENOSCOPY (EGD);  Surgeon: West Bali, MD;  Location: [facility];  Service: Endoscopy;  Laterality: N/A;  EGD for capsule placement, patient has difficulty swallowing large pills and known schatzki ring   ESOPHAGOGASTRODUODENOSCOPY (EGD) WITH PROPOFOL N/A 06/26/2018   Procedure: ESOPHAGOGASTRODUODENOSCOPY (EGD) WITH PROPOFOL;  Surgeon: West Bali, MD;  Location: [facility];  Service: Endoscopy;  Laterality: N/A;   GIVENS CAPSULE STUDY N/A 06/29/2018   Procedure: GIVENS CAPSULE STUDY;  Surgeon: West Bali, MD;  Location: [facility];  Service: Endoscopy;  Laterality: N/A;   LUMBAR LAMINECTOMY Right 09/11/2013   Procedure: MICRODISCECTOMY LUMBAR LAMINECTOMY;  Surgeon: Eldred Manges, MD;  Location: [facility] OR;  Service: Orthopedics;  Laterality: Right;  Right L2-3 Microdiscectomy   LUMBAR LAMINECTOMY/DECOMPRESSION MICRODISCECTOMY N/A 06/29/2015   Procedure: Right L3-4 Microdiscectomy;  Surgeon: Eldred Manges, MD;  Location: [facility] OR;  Service: Orthopedics;  Laterality: N/A;  TUBAL LIGATION      Current Medications: Current Outpatient Medications on File Prior to Visit  Medication Sig Dispense Refill   acetaminophen (TYLENOL) 500 MG tablet Take 500 mg by mouth every 6 (six) hours as needed for mild pain or moderate pain.     bismuth-metronidazole-tetracycline (PYLERA) 140-125-125 MG capsule Take 2 capsules by mouth daily.     calcium-vitamin D (OSCAL WITH D) 500-200 MG-UNIT tablet Take 2 tablets by mouth daily.     Cranberry, Vacc oxycoccus, (HM CRANBERRY) 500 MG TABS Take 1,000 mg by  mouth daily.     estradiol (ESTRACE) 0.1 MG/GM vaginal cream Place 1 Applicatorful vaginally 3 (three) times a week.     guaiFENesin (ROBITUSSIN) 100 MG/5ML SOLN Take 5 mLs by mouth every 4 (four) hours as needed for cough or to loosen phlegm.     metoprolol succinate (TOPROL-XL) 25 MG 24 hr tablet Take 0.5 tablets by mouth daily.     Multiple Vitamin (MULTIVITAMIN WITH MINERALS) TABS tablet Take 1 tablet by mouth daily.     omeprazole (PRILOSEC) 40 MG capsule Take 40 mg by mouth daily.     pantoprazole (PROTONIX) 40 MG tablet Take 1 tablet (40 mg total) by mouth daily before breakfast. 30 tablet 0   polyethylene glycol (MIRALAX / GLYCOLAX) 17 g packet Take 17 g by mouth daily. 14 each 0   rivaroxaban (XARELTO) 20 MG TABS tablet Take 20 mg by mouth daily.     rosuvastatin (CRESTOR) 10 MG tablet Take 10 mg by mouth daily.     amoxicillin-clavulanate (AUGMENTIN) 875-125 MG tablet Take 1 tablet by mouth every 12 (twelve) hours. (Patient not taking: Reported on 09/07/2022) 14 tablet 0   cetirizine (ZYRTEC) 10 MG tablet Take 10 mg by mouth daily as needed for allergies. (Patient not taking: Reported on 09/07/2022)     No current facility-administered medications on file prior to visit.     Allergies:   Nitrofurantoin, Denosumab, Fosamax [alendronate sodium], and Sulfamethoxazole-trimethoprim   Social History   Socioeconomic History   Marital status: Divorced    Spouse name: Not on file   Number of children: Not on file   Years of education: Not on file   Highest education level: Not on file  Occupational History   Not on file  Tobacco Use   Smoking status: Never   Smokeless tobacco: Never  Substance and Sexual Activity   Alcohol use: No   Drug use: No   Sexual activity: Never  Other Topics Concern   Not on file  Social History Narrative   Not on file   Social Determinants of Health   Financial Resource Strain: Not on file  Food Insecurity: Not on file  Transportation Needs: Not on  file  Physical Activity: Not on file  Stress: Not on file  Social Connections: Not on file     Family History: The patient's family history includes Cancer in her father. There is no history of Breast cancer or Colon polyps.  ROS:   Please see the history of present illness.     All other systems reviewed and are negative.  EKGs/Labs/Other Studies Reviewed:    The following studies were reviewed today:   EKG:  EKG is  ordered today.  The ekg ordered today demonstrates   09/07/2022- NSR  Recent Labs: 07/31/2022: ALT 12; B Natriuretic Peptide 47.2; BUN 12; Creatinine, Ser 0.80; Hemoglobin 14.0; Magnesium 1.8; Platelets 200; Potassium 4.2; Sodium 136; TSH 1.140  Recent Lipid Panel No  results found for: "CHOL", "TRIG", "HDL", "CHOLHDL", "VLDL", "LDLCALC", "LDLDIRECT"   Risk Assessment/Calculations:    CHA2DS2-VASc Score = 3   This indicates a 3.2% annual risk of stroke. The patient's score is based upon: CHF History: 0 HTN History: 0 Diabetes History: 0 Stroke History: 0 Vascular Disease History: 0 Age Score: 2 Gender Score: 1               Physical Exam:    VS:  BP 132/80 (BP Location: Left Arm, Patient Position: Sitting, Cuff Size: Normal)   Pulse 70   Ht 5\' 5"  (1.651 m)   Wt 148 lb 12.8 oz (67.5 kg)   SpO2 95%   BMI 24.76 kg/m     Wt Readings from Last 3 Encounters:  09/07/22 148 lb 12.8 oz (67.5 kg)  07/31/22 149 lb 14.6 oz (68 kg)  02/23/22 150 lb (68 kg)     GEN:  Well nourished, well developed in no acute distress HEENT: Normal NECK: No JVD; No carotid bruits LYMPHATICS: No lymphadenopathy CARDIAC: RRR, no murmurs, rubs, gallops RESPIRATORY:  Clear to auscultation without rales, wheezing or rhonchi  ABDOMEN: Soft, non-tender, non-distended MUSCULOSKELETAL:  No edema; No deformity  SKIN: Warm and dry NEUROLOGIC:  Alert and oriented x 3 PSYCHIATRIC:  Normal affect   ASSESSMENT:   Paroxysmal Atrial Fibrillation: NSR today - continue BB -  continue xarelto - will get TTE  Diverticulitis/GI bleed: had melena in February, no recurrence since then. We discussed that if this recurs, she should stop her xarelto immediately. Would recommend a GI w/u at that time, afib should not preclude the study. PLAN:    In order of problems listed above:  TTE Follow up 6 months     Medication Adjustments/Labs and Tests Ordered: Current medicines are reviewed at length with the patient today.  Concerns regarding medicines are outlined above.  Orders Placed This Encounter  Procedures   EKG 12-Lead   ECHOCARDIOGRAM COMPLETE   No orders of the defined types were placed in this encounter.   Patient Instructions  Medication Instructions:  The current medical regimen is effective;  continue present plan and medications.  *If you need a refill on your cardiac medications before your next appointment, please call your pharmacy*   Testing/Procedures: Echocardiogram - Your physician has requested that you have an echocardiogram. Echocardiography is a painless test that uses sound waves to create images of your heart. It provides your doctor with information about the size and shape of your heart and how well your heart's chambers and valves are working. This procedure takes approximately one hour. There are no restrictions for this procedure.     Follow-Up: At [facility], you and your health needs are our priority.  As part of our continuing mission to provide you with exceptional heart care, we have created designated Provider Care Teams.  These Care Teams include your primary Cardiologist (physician) and Advanced Practice Providers (APPs -  Physician Assistants and Nurse Practitioners) who all work together to provide you with the care you need, when you need it.  We recommend signing up for the patient portal called "MyChart".  Sign up information is provided on this After Visit Summary.  MyChart is used to connect with patients  for Virtual Visits (Telemedicine).  Patients are able to view lab/test results, encounter notes, upcoming appointments, etc.  Non-urgent messages can be sent to your provider as well.   To learn more about what you can do with MyChart, go  to [email or web address].    Your next appointment:   6 month(s)  Provider:   Carolan Clines, MD      Signed, Maisie Fus, MD  09/07/2022 11:15 AM    [facility]

## 2022-09-12 NOTE — Progress Notes (Signed)
Chart was reviewed and it appears patient was having the lightheadedness, fatigue, palpitations, and had rales on exam and BNP was ordered to rule out acute fluid overload from CHF contributing to her symptoms.

## 2022-09-23 ENCOUNTER — Ambulatory Visit (HOSPITAL_COMMUNITY): Payer: Medicare HMO | Attending: Internal Medicine

## 2022-09-23 DIAGNOSIS — I4891 Unspecified atrial fibrillation: Secondary | ICD-10-CM | POA: Diagnosis not present

## 2022-09-23 LAB — ECHOCARDIOGRAM COMPLETE
Area-P 1/2: 3.42 cm2
S' Lateral: 3.2 cm

## 2022-09-26 ENCOUNTER — Encounter: Payer: Self-pay | Admitting: Internal Medicine

## 2022-12-13 ENCOUNTER — Encounter (HOSPITAL_COMMUNITY): Payer: Self-pay | Admitting: Emergency Medicine

## 2022-12-13 ENCOUNTER — Emergency Department (HOSPITAL_COMMUNITY)
Admission: EM | Admit: 2022-12-13 | Discharge: 2022-12-13 | Disposition: A | Discharge status: Home / Self Care | Payer: Medicare HMO | Attending: Emergency Medicine | Admitting: Emergency Medicine

## 2022-12-13 ENCOUNTER — Emergency Department (HOSPITAL_COMMUNITY): Payer: Medicare HMO

## 2022-12-13 ENCOUNTER — Other Ambulatory Visit: Payer: Self-pay

## 2022-12-13 DIAGNOSIS — R109 Unspecified abdominal pain: Secondary | ICD-10-CM | POA: Diagnosis present

## 2022-12-13 DIAGNOSIS — N390 Urinary tract infection, site not specified: Secondary | ICD-10-CM | POA: Insufficient documentation

## 2022-12-13 LAB — CBC
HCT: 43.3 % (ref 36.0–46.0)
Hemoglobin: 13.8 g/dL (ref 12.0–15.0)
MCH: 29.1 pg (ref 26.0–34.0)
MCHC: 31.9 g/dL (ref 30.0–36.0)
MCV: 91.4 fL (ref 80.0–100.0)
Platelets: 230 10*3/uL (ref 150–400)
RBC: 4.74 MIL/uL (ref 3.87–5.11)
RDW: 13.5 % (ref 11.5–15.5)
WBC: 8.8 10*3/uL (ref 4.0–10.5)
nRBC: 0 % (ref 0.0–0.2)

## 2022-12-13 LAB — COMPREHENSIVE METABOLIC PANEL
ALT: 9 U/L (ref 0–44)
AST: 21 U/L (ref 15–41)
Albumin: 3.4 g/dL — ABNORMAL LOW (ref 3.5–5.0)
Alkaline Phosphatase: 45 U/L (ref 38–126)
Anion gap: 10 (ref 5–15)
BUN: 13 mg/dL (ref 8–23)
CO2: 22 mmol/L (ref 22–32)
Calcium: 8.8 mg/dL — ABNORMAL LOW (ref 8.9–10.3)
Chloride: 102 mmol/L (ref 98–111)
Creatinine, Ser: 0.75 mg/dL (ref 0.44–1.00)
GFR, Estimated: >60 mL/min (ref >60)
Glucose, Bld: 109 mg/dL — ABNORMAL HIGH (ref 70–99)
Potassium: 4.3 mmol/L (ref 3.5–5.1)
Sodium: 134 mmol/L — ABNORMAL LOW (ref 135–145)
Total Bilirubin: 1.5 mg/dL — ABNORMAL HIGH (ref 0.3–1.2)
Total Protein: 7.1 g/dL (ref 6.5–8.1)

## 2022-12-13 LAB — URINALYSIS, ROUTINE W REFLEX MICROSCOPIC
Bilirubin Urine: NEGATIVE
Glucose, UA: NEGATIVE mg/dL
Ketones, ur: NEGATIVE mg/dL
Nitrite: POSITIVE — AB
Protein, ur: NEGATIVE mg/dL
Specific Gravity, Urine: <1.005 — ABNORMAL LOW (ref 1.005–1.030)
pH: 6.5 (ref 5.0–8.0)

## 2022-12-13 LAB — LIPASE, BLOOD: Lipase: 34 U/L (ref 11–51)

## 2022-12-13 LAB — URINALYSIS, MICROSCOPIC (REFLEX)

## 2022-12-13 MED ORDER — ACETAMINOPHEN 500 MG PO TABS
1000.0000 mg | ORAL_TABLET | Freq: Once | ORAL | Status: AC
  Administered 2022-12-13: 1000 mg via ORAL
  Filled 2022-12-13: qty 2

## 2022-12-13 MED ORDER — ONDANSETRON 4 MG PO TBDP: 4.0000 mg | ORAL_TABLET | Freq: Three times a day (TID) | ORAL | 0 refills | Status: DC | PRN

## 2022-12-13 MED ORDER — CEPHALEXIN 500 MG PO CAPS: 500.0000 mg | ORAL_CAPSULE | Freq: Two times a day (BID) | ORAL | 0 refills | Status: AC

## 2022-12-13 MED ORDER — SODIUM CHLORIDE 0.9 % IV SOLN
1.0000 g | Freq: Once | INTRAVENOUS | Status: AC
  Administered 2022-12-13: 1 g via INTRAVENOUS
  Filled 2022-12-13: qty 10

## 2022-12-13 MED ORDER — IOHEXOL 350 MG/ML SOLN
80.0000 mL | Freq: Once | INTRAVENOUS | Status: AC | PRN
  Administered 2022-12-13: 80 mL via INTRAVENOUS

## 2022-12-13 NOTE — ED Triage Notes (Signed)
Patient arrives ambulatory by POV c/o left lower sided sharp pain radiating into her lower left back onset of yesterday. Denies any N/V/D. States pain improved after BM this am.

## 2022-12-13 NOTE — ED Provider Notes (Signed)
[place] EMERGENCY DEPARTMENT AT [facility] Provider Note   CSN: [id number] Arrival date & time: 12/13/22  4098     History  Chief Complaint  Patient presents with   Abdominal Pain    Stephanie Ingram is a 82 y.o. female.  HPI Patient presents with her son who assists with the history.  She presents with left-sided pain, left lower back, nausea, vomiting, diarrhea not present. Pain improved after bowel movement today.  No dysuria, no fever. She states that she is generally well with so prior to the event today.    Home Medications Prior to Admission medications   Medication Sig Start Date End Date Taking? Authorizing Provider  cephALEXin (KEFLEX) 500 MG capsule Take 1 capsule (500 mg total) by mouth 2 (two) times daily for 5 days. 12/13/22 12/18/22 Yes Gerhard Munch, MD  ondansetron (ZOFRAN-ODT) 4 MG disintegrating tablet Take 1 tablet (4 mg total) by mouth every 8 (eight) hours as needed for nausea or vomiting. 12/13/22  Yes Gerhard Munch, MD  acetaminophen (TYLENOL) 500 MG tablet Take 500 mg by mouth every 6 (six) hours as needed for mild pain or moderate pain.    [provider]  amoxicillin-clavulanate (AUGMENTIN) 875-125 MG tablet Take 1 tablet by mouth every 12 (twelve) hours. Patient not taking: Reported on 09/07/2022 02/23/22   Cecil Cobbs, PA-C  bismuth-metronidazole-tetracycline [facility]) [phone] MG capsule Take 2 capsules by mouth daily.    [provider]  calcium-vitamin D (OSCAL WITH D) 500-200 MG-UNIT tablet Take 2 tablets by mouth daily.    [provider]  cetirizine (ZYRTEC) 10 MG tablet Take 10 mg by mouth daily as needed for allergies. Patient not taking: Reported on 09/07/2022    [provider]  Cranberry, Vacc oxycoccus, (HM CRANBERRY) 500 MG TABS Take 1,000 mg by mouth daily.    [provider]  estradiol (ESTRACE) 0.1 MG/GM vaginal cream Place 1 Applicatorful vaginally 3 (three) times a  week.    [provider]  guaiFENesin (ROBITUSSIN) 100 MG/5ML SOLN Take 5 mLs by mouth every 4 (four) hours as needed for cough or to loosen phlegm.    [provider]  metoprolol succinate (TOPROL-XL) 25 MG 24 hr tablet Take 0.5 tablets by mouth daily. 08/11/22   [provider]  Multiple Vitamin (MULTIVITAMIN WITH MINERALS) TABS tablet Take 1 tablet by mouth daily.    [provider]  omeprazole (PRILOSEC) 40 MG capsule Take 40 mg by mouth daily. 05/11/22 06/13/23  [provider]  pantoprazole (PROTONIX) 40 MG tablet Take 1 tablet (40 mg total) by mouth daily before breakfast. 07/01/18   Erick Blinks, MD  polyethylene glycol (MIRALAX / GLYCOLAX) 17 g packet Take 17 g by mouth daily. 02/23/22   Cecil Cobbs, PA-C  rivaroxaban (XARELTO) 20 MG TABS tablet Take 20 mg by mouth daily. 08/11/22   [provider]  rosuvastatin (CRESTOR) 10 MG tablet Take 10 mg by mouth daily. 09/20/21   [provider]      Allergies    Nitrofurantoin, Denosumab, Fosamax [alendronate sodium], and Sulfamethoxazole-trimethoprim    Review of Systems   Review of Systems  All other systems reviewed and are negative.   Physical Exam Updated Vital Signs BP (!) 150/81   Pulse 81   Temp 99.3 F (37.4 C) (Oral)   Resp 18   Ht 5\' 5"  (1.651 m)   Wt 67.1 kg   SpO2 95%   BMI 24.63 kg/m  Physical Exam  Vitals and nursing note reviewed.  Constitutional:      General: She is not in acute distress.    Appearance: She is well-developed.  HENT:     Head: Normocephalic and atraumatic.  Eyes:     Conjunctiva/sclera: Conjunctivae normal.  Cardiovascular:     Rate and Rhythm: Normal rate and regular rhythm.  Pulmonary:     Effort: Pulmonary effort is normal. No respiratory distress.     Breath sounds: Normal breath sounds. No stridor.  Abdominal:     General: There is no distension.     Tenderness: There is abdominal tenderness in the left lower  quadrant.  Skin:    General: Skin is warm and dry.  Neurological:     Mental Status: She is alert and oriented to person, place, and time.     Cranial Nerves: No cranial nerve deficit.  Psychiatric:        Mood and Affect: Mood normal.     ED Results / Procedures / Treatments   Labs (all labs ordered are listed, but only abnormal results are displayed) Labs Reviewed  COMPREHENSIVE METABOLIC PANEL - Abnormal; Notable for the following components:      Result Value   Sodium 134 (*)    Glucose, Bld 109 (*)    Calcium 8.8 (*)    Albumin 3.4 (*)    Total Bilirubin 1.5 (*)    All other components within normal limits  URINALYSIS, ROUTINE W REFLEX MICROSCOPIC - Abnormal; Notable for the following components:   APPearance HAZY (*)    Specific Gravity, Urine <1.005 (*)    Hgb urine dipstick SMALL (*)    Nitrite POSITIVE (*)    Leukocytes,Ua SMALL (*)    All other components within normal limits  URINALYSIS, MICROSCOPIC (REFLEX) - Abnormal; Notable for the following components:   Bacteria, UA MANY (*)    All other components within normal limits  LIPASE, BLOOD  CBC    EKG None  Radiology CT ABDOMEN PELVIS W CONTRAST  Result Date: 12/13/2022 CLINICAL DATA:  LLQ pain. EXAM: CT ABDOMEN AND PELVIS WITH CONTRAST TECHNIQUE: Multidetector CT imaging of the abdomen and pelvis was performed using the standard protocol following bolus administration of intravenous contrast. RADIATION DOSE REDUCTION: This exam was performed according to the departmental dose-optimization program which includes automated exposure control, adjustment of the mA and/or kV according to patient size and/or use of iterative reconstruction technique. CONTRAST:  80mL OMNIPAQUE IOHEXOL 350 MG/ML SOLN COMPARISON:  02/23/2022. FINDINGS: Lower chest: Minimal dependent basilar subsegmental atelectasis or scarring. No pleural or pericardial effusion. Hepatobiliary: No focal abnormalities in the liver. No biliary ductal  dilatation identified. Sludge in the gallbladder. No pericholecystic inflammatory changes. Pancreas: Unremarkable. No pancreatic ductal dilatation or surrounding inflammatory changes. Spleen: Normal in size without focal abnormality. Adrenals/Urinary Tract: Right adrenal gland unremarkable. Left adrenal 1.8 cm low-attenuation nodule consistent with an adenoma is stable finding. Kidneys are normal, without renal calculi, focal lesion, or hydronephrosis. Bladder is unremarkable. Stomach/Bowel: Stomach is within normal limits. Appendix appears normal. No evidence of bowel wall thickening, distention, or inflammatory changes. There is diffuse colonic diverticulosis. No evidence of diverticulitis. Vascular/Lymphatic: Aortic atherosclerosis. No enlarged abdominal or pelvic lymph nodes. Reproductive: Uterus and bilateral adnexa are unremarkable. Other: No abdominal wall hernia or abnormality. No abdominopelvic ascites. Musculoskeletal: Thoracolumbosacral degenerative changes. IMPRESSION: 1. Diverticulosis. 2. Left adrenal adenoma. 3. No acute abdominal or pelvic pathology. Electronically Signed   By: Layla Maw M.D.   On: 12/13/2022 13:48  Procedures Procedures    Medications Ordered in ED Medications  iohexol (OMNIPAQUE) 350 MG/ML injection 80 mL (80 mLs Intravenous Contrast Given 12/13/22 1338)  cefTRIAXone (ROCEPHIN) 1 g in sodium chloride 0.9 % 100 mL IVPB (1 g Intravenous New Bag/Given 12/13/22 1509)    ED Course/ Medical Decision Making/ A&P                                 Medical Decision Making Elderly female presents with left lower quadrant abdominal pain.  Given her age, hypertension, differential including aortic issues, GI issues, GU issues including stone versus infection considered. Diverticulitis primary consideration. Patient had CT, labs monitoring. Cardiac 80 sinus normal Pulse ox 95% room air normal   Amount and/or Complexity of Data Reviewed Independent Historian:      Details: Son at bedside Labs: ordered. Decision-making details documented in ED Course. Radiology: ordered and independent interpretation performed. Decision-making details documented in ED Course.  Risk Prescription drug management. Decision regarding hospitalization.   3:44 PM Patient awake and alert, no distress, hemodynamically the same.  She has received IV ceftriaxone as labs, CT consistent with urinary tract infection, no perinephric abscess, no evidence for bacteremia, sepsis, no cons for diverticulitis or perforation, patient comfortable with discharge after initial ceftriaxone.        Final Clinical Impression(s) / ED Diagnoses Final diagnoses:  Lower urinary tract infectious disease    Rx / DC Orders ED Discharge Orders          Ordered    cephALEXin (KEFLEX) 500 MG capsule  2 times daily        12/13/22 1544    ondansetron (ZOFRAN-ODT) 4 MG disintegrating tablet  Every 8 hours PRN        12/13/22 1544              Gerhard Munch, MD 12/13/22 1544

## 2023-03-07 ENCOUNTER — Ambulatory Visit: Payer: Medicare HMO | Attending: Internal Medicine | Admitting: Internal Medicine

## 2023-03-07 ENCOUNTER — Encounter: Payer: Self-pay | Admitting: Internal Medicine

## 2023-03-07 VITALS — BP 128/66 | HR 75 | Ht 65.0 in | Wt 149.6 lb

## 2023-03-07 DIAGNOSIS — I4891 Unspecified atrial fibrillation: Secondary | ICD-10-CM | POA: Diagnosis not present

## 2023-03-07 NOTE — Progress Notes (Unsigned)
Cardiology Office Note:    Date:  03/07/2023   ID:  Stephanie Ingram, DOB February 21, 1941, MRN [id number]  PCP:  Mattie Marlin, DO   [facility] Providers Cardiologist:  None     Referring MD: Mattie Marlin, DO   No chief complaint on file. Atrial Fibrillation  History of Present Illness:    Stephanie Ingram is a 82 y.o. female with a hx of GERD, HLD, referral for afib. She's been evaluated at [facility] in family medicine. She was started on metop XL 12.5 mg daily and xarelto 20 mg daily.   Per their documentation: " Patient went back to the emergency room on 07/31/2022 with heart palpitations and dysuria with fatigue and lightheadedness. On the heart monitor emergency room patient was noted to have PVCs but no atrial fibrillation. Patient's labs showed negative troponins, normal BMP, negative urine culture/urinalysis, normal CBC, stable BMP, normal TSH. Patient was then discharged home.  It was noted that the patient's heart monitor showed predominantly normal sinus rhythm with average heart rate of 76 and low heart rate of 52 with a maximum heart rate of 178. It was noted that patient did have atrial fibrillation/flutter occurring 5% of the time with heart rate ranging from 72-178 with the longest lasting approximately 40 minutes with average heart rate of 102. "  Today she is in sinus rhythm. No palpitations. No bleeding currently. She had diverticulitis in Feb.  She was planned for GI work up but was found to be in afib so it was canceled. Hgb 07/31/2022 was 14. No cardiac dx hx. No recent syncope. No LE edema, no orthopnea or PND. No thyroid dx. No stroke. No DM2. No OSA   09/07/2022- NSR, no ischemic changes  Interim hx 03/07/2023 Stephanie Ingram presents today for follow-up, she is in normal rhythm. No hospitalizations in the last 2-3 months.  She gets palpitations, but resolves. She denies CP or SOB.    Current Medications: Current Outpatient Medications on File Prior to  Visit  Medication Sig Dispense Refill   acetaminophen (TYLENOL) 500 MG tablet Take 500 mg by mouth every 6 (six) hours as needed for mild pain or moderate pain.     amoxicillin-clavulanate (AUGMENTIN) 875-125 MG tablet Take 1 tablet by mouth every 12 (twelve) hours. (Patient not taking: Reported on 09/07/2022) 14 tablet 0   bismuth-metronidazole-tetracycline (PYLERA) 140-125-125 MG capsule Take 2 capsules by mouth daily.     calcium-vitamin D (OSCAL WITH D) 500-200 MG-UNIT tablet Take 2 tablets by mouth daily.     cetirizine (ZYRTEC) 10 MG tablet Take 10 mg by mouth daily as needed for allergies. (Patient not taking: Reported on 09/07/2022)     Cranberry, Vacc oxycoccus, (HM CRANBERRY) 500 MG TABS Take 1,000 mg by mouth daily.     estradiol (ESTRACE) 0.1 MG/GM vaginal cream Place 1 Applicatorful vaginally 3 (three) times a week.     guaiFENesin (ROBITUSSIN) 100 MG/5ML SOLN Take 5 mLs by mouth every 4 (four) hours as needed for cough or to loosen phlegm.     metoprolol succinate (TOPROL-XL) 25 MG 24 hr tablet Take 0.5 tablets by mouth daily.     Multiple Vitamin (MULTIVITAMIN WITH MINERALS) TABS tablet Take 1 tablet by mouth daily.     omeprazole (PRILOSEC) 40 MG capsule Take 40 mg by mouth daily.     ondansetron (ZOFRAN-ODT) 4 MG disintegrating tablet Take 1 tablet (4 mg total) by mouth every 8 (eight) hours as needed for nausea or vomiting.  20 tablet 0   pantoprazole (PROTONIX) 40 MG tablet Take 1 tablet (40 mg total) by mouth daily before breakfast. 30 tablet 0   polyethylene glycol (MIRALAX / GLYCOLAX) 17 g packet Take 17 g by mouth daily. 14 each 0   rivaroxaban (XARELTO) 20 MG TABS tablet Take 20 mg by mouth daily.     rosuvastatin (CRESTOR) 10 MG tablet Take 10 mg by mouth daily.     No current facility-administered medications on file prior to visit.   Family History: The patient's family history includes Cancer in her father. There is no history of Breast cancer or Colon polyps.  ROS:    Please see the history of present illness.     All other systems reviewed and are negative.  EKGs/Labs/Other Studies Reviewed:    The following studies were reviewed today:  TTE 09/23/2022 LVEF 55-60%,  RV systolic fxn is normal, no PHTN Mild LA dilation. No significant valve disease  EKG:  EKG is  ordered today.  The ekg ordered today demonstrates   09/07/2022- NSR  Recent Labs: 07/31/2022: B Natriuretic Peptide 47.2; Magnesium 1.8; TSH 1.140 12/13/2022: ALT 9; BUN 13; Creatinine, Ser 0.75; Hemoglobin 13.8; Platelets 230; Potassium 4.3; Sodium 134  Recent Lipid Panel No results found for: "CHOL", "TRIG", "HDL", "CHOLHDL", "VLDL", "LDLCALC", "LDLDIRECT"   Risk Assessment/Calculations:    CHA2DS2-VASc Score = 3   This indicates a 3.2% annual risk of stroke. The patient's score is based upon: CHF History: 0 HTN History: 0 Diabetes History: 0 Stroke History: 0 Vascular Disease History: 0 Age Score: 2 Gender Score: 1   {This patient has a significant risk of stroke if diagnosed with atrial fibrillation.  Please consider VKA or DOAC agent for anticoagulation if the bleeding risk is acceptable.   You can also use the SmartPhrase .[email or web address] for documentation.   :[id number]}  No BP recorded.  {Refresh Note OR Click here to enter BP  :1}***         Physical Exam:    VS:   Vitals:   03/07/23 1517  BP: 128/66  Pulse: 75  SpO2: 96%     Wt Readings from Last 3 Encounters:  12/13/22 148 lb (67.1 kg)  09/07/22 148 lb 12.8 oz (67.5 kg)  07/31/22 149 lb 14.6 oz (68 kg)     GEN:  Well nourished, well developed in no acute distress HEENT: Normal NECK: No JVD; No carotid bruits LYMPHATICS: No lymphadenopathy CARDIAC: RRR, no murmurs, rubs, gallops RESPIRATORY:  Clear to auscultation without rales, wheezing or rhonchi  ABDOMEN: Soft, non-tender, non-distended MUSCULOSKELETAL:  No edema; No deformity  SKIN: Warm and dry NEUROLOGIC:  Alert and oriented x 3 PSYCHIATRIC:   Normal affect   ASSESSMENT:   Paroxysmal Atrial Fibrillation: Normal LV function, LA only mildly enlarged. Will continue rate control for now - NSR today  - continue BB - continue xarelto  Diverticulitis/GI bleed: had melena in February, no recurrence since then. We discussed that if this recurs, she should stop her xarelto immediately. Would recommend a GI w/u at that time, afib should not preclude the study. PLAN:    In order of problems listed above:   Follow up 6 months     Medication Adjustments/Labs and Tests Ordered: Current medicines are reviewed at length with the patient today.  Concerns regarding medicines are outlined above.  No orders of the defined types were placed in this encounter.  No orders of the defined types were placed in this encounter.  There are no Patient Instructions on file for this visit.   Signed, Maisie Fus, MD  03/07/2023 1:06 PM    [facility]

## 2023-03-07 NOTE — Patient Instructions (Signed)
Medication Instructions:  No changes at this time *If you need a refill on your cardiac medications before your next appointment, please call your pharmacy*  Follow-Up: At [facility], you and your health needs are our priority.  As part of our continuing mission to provide you with exceptional heart care, we have created designated Provider Care Teams.  These Care Teams include your primary Cardiologist (physician) and Advanced Practice Providers (APPs -  Physician Assistants and Nurse Practitioners) who all work together to provide you with the care you need, when you need it.   Your next appointment:   6 month(s)  Provider:   Maisie Fus, MD

## 2023-09-06 ENCOUNTER — Ambulatory Visit

## 2023-09-06 ENCOUNTER — Encounter: Payer: Self-pay | Admitting: Cardiology

## 2023-09-06 ENCOUNTER — Ambulatory Visit: Attending: Cardiology | Admitting: Cardiology

## 2023-09-06 ENCOUNTER — Other Ambulatory Visit (HOSPITAL_COMMUNITY): Payer: Self-pay

## 2023-09-06 VITALS — BP 128/72 | HR 76 | Ht 65.0 in | Wt 149.6 lb

## 2023-09-06 DIAGNOSIS — I480 Paroxysmal atrial fibrillation: Secondary | ICD-10-CM | POA: Diagnosis not present

## 2023-09-06 DIAGNOSIS — E782 Mixed hyperlipidemia: Secondary | ICD-10-CM

## 2023-09-06 DIAGNOSIS — R0683 Snoring: Secondary | ICD-10-CM | POA: Diagnosis not present

## 2023-09-06 MED ORDER — METOPROLOL SUCCINATE ER 25 MG PO TB24
25.0000 mg | ORAL_TABLET | Freq: Every day | ORAL | 3 refills | Status: AC
  Filled 2023-09-06: qty 90, 90d supply, fill #0
  Filled 2024-03-19 (×2): qty 90, 90d supply, fill #1

## 2023-09-06 MED ORDER — DILTIAZEM HCL 30 MG PO TABS
30.0000 mg | ORAL_TABLET | Freq: Three times a day (TID) | ORAL | 3 refills | Status: AC | PRN
  Filled 2023-09-06: qty 30, 10d supply, fill #0

## 2023-09-06 NOTE — Progress Notes (Addendum)
 Cardiology Office Note:  .   Date:  09/06/2023  ID:  Stephanie Ingram, DOB [birth year], MRN [id number] PCP: Ingram, Stephanie P, DO  [facility] Providers Cardiologist:  Stephanie Ivy, MD PCP: Ingram, Stephanie P, DO  Chief Complaint  Patient presents with   Dizziness    Patient states she shuts her eyes and gets dizzy/ feels like is going to fall ?      Stephanie Ingram is a 83 y.o. female with hyperlipidemia, paroxysmal A-fib  History of Present Illness  Patient was last seen by Dr. Amanda Ingram in 03/2023.  A-fib was diagnosed at [facility].  Patient is here today with her daughter.  Patient notices that she has been having more complaints of palpitations lately, although does not feel them right this minute.  She is compliant with her Xarelto.  She usually takes metoprolol  succinate 25 mg half tablet every morning, but ends up taking another half tablet during the day due to symptoms of palpitations which seem to help.  On further questioning, she endorses snoring at night.     Vitals:   09/06/23 0818  BP: 128/72  Pulse: 76  SpO2: 94%      Review of Systems  Cardiovascular:  Positive for palpitations.  Respiratory:  Positive for snoring.         Studies Reviewed: Stephanie Ingram        Independently interpreted 03/2023: Chol 176, TG 132, HDL 64, LDL 89 Hb 15 Cr 0.79  07/2022: TSH 0.7  Echocardiogram 09/2022:  1. Left ventricular ejection fraction, by estimation, is 55 to 60%. Left  ventricular ejection fraction by 3D volume is 54 %. The left ventricle has  normal function. The left ventricle has no regional wall motion  abnormalities. Left ventricular diastolic   parameters are consistent with Grade I diastolic dysfunction (impaired  relaxation).   2. Right ventricular systolic function is normal. The right ventricular  size is normal. There is normal pulmonary artery systolic pressure. The  estimated right ventricular systolic pressure is 25.5  mmHg.   3. Left atrial size was mildly dilated.   4. The mitral valve is normal in structure. Trivial mitral valve  regurgitation.   5. The aortic valve is tricuspid. There is mild calcification of the  aortic valve. Aortic valve regurgitation is not visualized. Aortic valve  sclerosis is present, with no evidence of aortic valve stenosis.   6. The inferior vena cava is normal in size with greater than 50%  respiratory variability, suggesting right atrial pressure of 3 mmHg.    Risk Assessment/Calculations:    CHA2DS2-VASc Score = 3   This indicates a 3.2% annual risk of stroke. The patient's score is based upon: CHF History: 0 HTN History: 0 Diabetes History: 0 Stroke History: 0 Vascular Disease History: 0 Age Score: 2 Gender Score: 1     Physical Exam Vitals and nursing note reviewed.  Constitutional:      General: She is not in acute distress. Neck:     Vascular: No JVD.  Cardiovascular:     Rate and Rhythm: Normal rate and regular rhythm.     Heart sounds: Normal heart sounds. No murmur heard. Pulmonary:     Effort: Pulmonary effort is normal.     Breath sounds: Normal breath sounds. No wheezing or rales.  Musculoskeletal:     Right lower leg: No edema.     Left lower leg: No edema.      VISIT DIAGNOSES:  ICD-10-CM   1. PAF (paroxysmal atrial fibrillation) (HCC)  I48.0     2. Snoring  R06.83     3. Mixed hyperlipidemia  E78.2        Stephanie Ingram is a 83 y.o. female with hyperlipidemia, paroxysmal A-fib  Assessment & Plan  Paroxysmal A-fib: Increased palpitations recently. Recommend 2-week ZIO monitor to assess A-fib burden. Increase metoprolol  succinate to 25 mg daily, and add diltiazem  30 mg every 8 hour as needed. Given her snoring and paroxysmal A-fib, recommend sleep study. If A-fib burden significantly high and not controlled with above medications, could consider referral to EP for consideration of antiarrhythmic therapy.  Ablation  less likely the right option given her age.  Mixed hyperlipidemia: Continue Crestor 10 mg daily.    Meds ordered this encounter  Medications   metoprolol  succinate (TOPROL -XL) 25 MG 24 hr tablet    Sig: Take 1 tablet (25 mg total) by mouth daily.    Dispense:  90 tablet    Refill:  3   diltiazem  (CARDIZEM ) 30 MG tablet    Sig: Take 1 tablet (30 mg total) by mouth every 8 (eight) hours as needed.    Dispense:  30 tablet    Refill:  3     F/u in 6 months  Signed, Stephanie Das, MD

## 2023-09-06 NOTE — Progress Notes (Unsigned)
 Applied a 14 day Zio XT monitor to patient in the office ?

## 2023-09-06 NOTE — Patient Instructions (Signed)
 Medication Instructions:  INCREASE Metoprolol to 1 tablet daily  START Diltiazem 30 mg every 8 hours as needed   *If you need a refill on your cardiac medications before your next appointment, please call your pharmacy*  Testing/Procedures: Zio 14 day monitor   Your physician has requested that you wear a Zio heart monitor for __14___ days. This will be mailed to your home with instructions on how to apply the monitor and how to return it when finished. Please allow 2 weeks after returning the heart monitor before our office calls you with the results.    Refer to sleep study    Follow-Up: At [facility], you and your health needs are our priority.  As part of our continuing mission to provide you with exceptional heart care, our providers are all part of one team.  This team includes your primary Cardiologist (physician) and Advanced Practice Providers or APPs (Physician Assistants and Nurse Practitioners) who all work together to provide you with the care you need, when you need it.  Your next appointment:   6 month(s)  Provider:   Cody Das, MD

## 2023-09-20 ENCOUNTER — Ambulatory Visit: Payer: Self-pay | Admitting: Cardiology

## 2023-09-20 DIAGNOSIS — I4891 Unspecified atrial fibrillation: Secondary | ICD-10-CM

## 2023-09-20 DIAGNOSIS — I480 Paroxysmal atrial fibrillation: Secondary | ICD-10-CM

## 2023-09-20 NOTE — Progress Notes (Signed)
 Frequent Afib/flutter noted, associated with symptoms. If symptoms not improved since recent medication changes (addition of diltiazem ), consider EP referral for symptomatic paroxysmal Afib/flutter. Continue Xarelto.  Thanks MJP

## 2023-09-26 ENCOUNTER — Ambulatory Visit: Attending: Cardiology | Admitting: Cardiology

## 2023-09-26 ENCOUNTER — Encounter: Payer: Self-pay | Admitting: Cardiology

## 2023-09-26 VITALS — BP 146/92 | HR 145 | Ht 65.0 in | Wt 149.0 lb

## 2023-09-26 DIAGNOSIS — D6869 Other thrombophilia: Secondary | ICD-10-CM

## 2023-09-26 DIAGNOSIS — I480 Paroxysmal atrial fibrillation: Secondary | ICD-10-CM | POA: Diagnosis not present

## 2023-09-26 MED ORDER — FLECAINIDE ACETATE 50 MG PO TABS: 50.0000 mg | ORAL_TABLET | Freq: Two times a day (BID) | ORAL | 6 refills | Status: DC

## 2023-09-26 NOTE — Patient Instructions (Addendum)
 Medication Instructions:  Your physician has recommended you make the following change in your medication:  START Flecainide 50 mg twice a day  *If you need a refill on your cardiac medications before your next appointment, please call your pharmacy*   Lab Work: Pre procedure labs -- we will call you to schedule:  BMP & CBC  If you have a lab test that is abnormal and we need to change your treatment, we will call you to review the results -- otherwise no news is good news.    Testing/Procedures: Your physician has requested that you have cardiac CT 3 weeks PRIOR to your ablation. Cardiac computed tomography (CT) is a painless test that uses an x-ray machine to take clear, detailed pictures of your heart. We will contact you if the result is abnormal. We will call you to schedule once procedure date is arranged.  Your physician has recommended that you have an ablation. Catheter ablation is a medical procedure used to treat some cardiac arrhythmias (irregular heartbeats). During catheter ablation, a long, thin, flexible tube is put into a blood vessel in your groin (upper thigh), or neck. This tube is called an ablation catheter. It is then guided to your heart through the blood vessel. Radio frequency waves destroy small areas of heart tissue where abnormal heartbeats may cause an arrhythmia to start.   We will call you to schedule this once October dates become available.    Follow-Up: At [facility], you and your health needs are our priority.  As part of our continuing mission to provide you with exceptional heart care, we have created designated Provider Care Teams.  These Care Teams include your primary Cardiologist (physician) and Advanced Practice Providers (APPs -  Physician Assistants and Nurse Practitioners) who all work together to provide you with the care you need, when you need it.  Your physician recommends that you schedule a follow-up appointment in: 2 weeks for a nurse  visit EKG. -- you are scheduled for 10/11/2023 @ 11:00 am   Your next appointment:   1 month(s) after your ablation  The format for your next appointment:   In Person  Provider:   AFib clinic   Thank you for choosing [facility]!!   Maeola Domino, RN [phone]    Other Instructions   Flecainide Tablets What is this medication? FLECAINIDE (FLEK a nide) prevents and treats a fast or irregular heartbeat (arrhythmia). It is often used to treat a type of arrhythmia known as AFib (atrial fibrillation). It works by slowing down overactive electric signals in the heart, which stabilizes your heart rhythm. It belongs to a group of medications called antiarrhythmics. This medicine may be used for other purposes; ask your health care provider or pharmacist if you have questions. COMMON BRAND NAME(S): Tambocor What should I tell my care team before I take this medication? They need to know if you have any of these conditions: High or low levels of potassium in the blood Heart disease including heart rhythm and heart rate problems Kidney disease Liver disease Recent heart attack An unusual or allergic reaction to flecainide, other medications, foods, dyes, or preservatives Pregnant or trying to get pregnant Breastfeeding How should I use this medication? Take this medication by mouth with a glass of water . Take it as directed on the prescription label at the same time every day. You can take it with or without food. If it upsets your stomach, take it with food. Do not take your medication  more often than directed. Do not stop taking this medication suddenly. This may cause serious, heart-related side effects. If your care team wants you to stop the medication, the dose may be slowly lowered over time to avoid any side effects. Talk to your care team about the use of this medication in children. While it may be prescribed for children as young as 1 year for selected conditions,  precautions do apply. Overdosage: If you think you have taken too much of this medicine contact a poison control center or emergency room at once. NOTE: This medicine is only for you. Do not share this medicine with others. What if I miss a dose? If you miss a dose, take it as soon as you can. If it is almost time for your next dose, take only that dose. Do not take double or extra doses. What may interact with this medication? Do not take this medication with any of the following: Amoxapine Arsenic trioxide Certain antibiotics, such as clarithromycin, erythromycin, gatifloxacin, gemifloxacin, levofloxacin, moxifloxacin, sparfloxacin, or troleandomycin Certain antidepressants, called tricyclic antidepressants such as amitriptyline, imipramine, or nortriptyline Certain medications for irregular heartbeat, such as disopyramide, encainide, moricizine, procainamide, propafenone, and quinidine Cisapride Delavirdine Droperidol Haloperidol Hawthorn Imatinib Levomethadyl Maprotiline Medications for malaria, such as chloroquine and halofantrine Pentamidine Phenothiazines, such as chlorpromazine, mesoridazine, prochlorperazine, thioridazine Pimozide Quinine Ranolazine Ritonavir Sertindole This medication may also interact with the following: Cimetidine Dofetilide Medications for angina or blood pressure Medications for irregular heartbeat, such as amiodarone and digoxin Ziprasidone This list may not describe all possible interactions. Give your health care provider a list of all the medicines, herbs, non-prescription drugs, or dietary supplements you use. Also tell them if you smoke, drink alcohol, or use illegal drugs. Some items may interact with your medicine. What should I watch for while using this medication? Visit your care team for regular checks on your progress. Because your condition and the use of this medication carries some risk, it is a good idea to carry an identification  card, necklace, or bracelet with details of your condition, medications, and care team. Check your blood pressure and pulse rate as directed. Know what your blood pressure and pulse rate should be and when tod contact your care team. Your care team may schedule regular blood tests and electrocardiograms to check your progress. This medication may affect your coordination, reaction time, or judgment. Do not drive or operate machinery until you know how this medication affects you. Sit up or stand slowly to reduce the risk of dizzy or fainting spells. Drinking alcohol with this medication can increase the risk of these side effects. What side effects may I notice from receiving this medication? Side effects that you should report to your care team as soon as possible: Allergic reactions--skin rash, itching, hives, swelling of the face, lips, tongue, or throat Heart failure--shortness of breath, swelling of the ankles, feet, or hands, sudden weight gain, unusual weakness or fatigue Heart rhythm changes--fast or irregular heartbeat, dizziness, feeling faint or lightheaded, chest pain, trouble breathing Liver injury--right upper belly pain, loss of appetite, nausea, light-colored stool, dark yellow or brown urine, yellowing skin or eyes, unusual weakness or fatigue Side effects that usually do not require medical attention (report to your care team if they continue or are bothersome): Blurry vision Constipation Dizziness Fatigue Headache Nausea Tremors or shaking This list may not describe all possible side effects. Call your doctor for medical advice about side effects. You may report side effects to FDA at  1-800-FDA-1088. Where should I keep my medication? Keep out of the reach of children and pets. Store at room temperature between 15 and 30 degrees C (59 and 86 degrees F). Protect from light. Keep container tightly closed. Throw away any unused medication after the expiration date. NOTE: This  sheet is a summary. It may not cover all possible information. If you have questions about this medicine, talk to your doctor, pharmacist, or health care provider.  2024 Elsevier/Gold Standard (2021-10-27 00:00:00)     Cardiac Ablation Cardiac ablation is a procedure to destroy (ablate) some heart tissue that is sending bad signals. These bad signals cause problems in heart rhythm. The heart has many areas that make these signals. If there are problems in these areas, they can make the heart beat in a way that is not normal. Destroying some tissues can help make the heart rhythm normal. Tell your doctor about: Any allergies you have. All medicines you are taking. These include vitamins, herbs, eye drops, creams, and over-the-counter medicines. Any problems you or family members have had with medicines that make you fall asleep (anesthetics). Any blood disorders you have. Any surgeries you have had. Any medical conditions you have, such as kidney failure. Whether you are pregnant or may be pregnant. What are the risks? This is a safe procedure. But problems may occur, including: Infection. Bruising and bleeding. Bleeding into the chest. Stroke or blood clots. Damage to nearby areas of your body. Allergies to medicines or dyes. The need for a pacemaker if the normal system is damaged. Failure of the procedure to treat the problem. What happens before the procedure? Medicines Ask your doctor about: Changing or stopping your normal medicines. This is important. Taking aspirin  and ibuprofen. Do not take these medicines unless your doctor tells you to take them. Taking other medicines, vitamins, herbs, and supplements. General instructions Follow instructions from your doctor about what you cannot eat or drink. Plan to have someone take you home from the hospital or clinic. If you will be going home right after the procedure, plan to have someone with you for 24 hours. Ask your doctor  what steps will be taken to prevent infection. What happens during the procedure?  An IV tube will be put into one of your veins. You will be given a medicine to help you relax. The skin on your neck or groin will be numbed. A cut (incision) will be made in your neck or groin. A needle will be put through your cut and into a large vein. A tube (catheter) will be put into the needle. The tube will be moved to your heart. Dye may be put through the tube. This helps your doctor see your heart. Small devices (electrodes) on the tube will send out signals. A type of energy will be used to destroy some heart tissue. The tube will be taken out. Pressure will be held on your cut. This helps stop bleeding. A bandage will be put over your cut. The exact procedure may vary among doctors and hospitals. What happens after the procedure? You will be watched until you leave the hospital or clinic. This includes checking your heart rate, breathing rate, oxygen, and blood pressure. Your cut will be watched for bleeding. You will need to lie still for a few hours. Do not drive for 24 hours or as long as your doctor tells you. Summary Cardiac ablation is a procedure to destroy some heart tissue. This is done to treat heart rhythm  problems. Tell your doctor about any medical conditions you may have. Tell him or her about all medicines you are taking to treat them. This is a safe procedure. But problems may occur. These include infection, bruising, bleeding, and damage to nearby areas of your body. Follow what your doctor tells you about food and drink. You may also be told to change or stop some of your medicines. After the procedure, do not drive for 24 hours or as long as your doctor tells you. This information is not intended to replace advice given to you by your health care provider. Make sure you discuss any questions you have with your health care provider. Document Revised: 06/11/2021 Document  Reviewed: 02/21/2019 Elsevier Patient Education  2023 Elsevier Inc.   Cardiac Ablation, Care After  This sheet gives you information about how to care for yourself after your procedure. Your health care provider may also give you more specific instructions. If you have problems or questions, contact your health care provider. What can I expect after the procedure? After the procedure, it is common to have: Bruising around your puncture site. Tenderness around your puncture site. Skipped heartbeats. If you had an atrial fibrillation ablation, you may have atrial fibrillation during the first several months after your procedure.  Tiredness (fatigue).  Follow these instructions at home: Puncture site care  Follow instructions from your health care provider about how to take care of your puncture site. Make sure you: If present, leave stitches (sutures), skin glue, or adhesive strips in place. These skin closures may need to stay in place for up to 2 weeks. If adhesive strip edges start to loosen and curl up, you may trim the loose edges. Do not remove adhesive strips completely unless your health care provider tells you to do that. If a large square bandage is present, this may be removed 24 hours after surgery.  Check your puncture site every day for signs of infection. Check for: Redness, swelling, or pain. Fluid or blood. If your puncture site starts to bleed, lie down on your back, apply firm pressure to the area, and contact your health care provider. Warmth. Pus or a bad smell. A pea or small marble sized lump at the site is normal and can take up to three months to resolve.  Driving Do not drive for at least 4 days after your procedure or however long your health care provider recommends. (Do not resume driving if you have previously been instructed not to drive for other health reasons.) Do not drive or use heavy machinery while taking prescription pain medicine. Activity Avoid  activities that take a lot of effort for at least 7 days after your procedure. Do not lift anything that is heavier than 5 lb (4.5 kg) for one week.  No sexual activity for 1 week.  Return to your normal activities as told by your health care provider. Ask your health care provider what activities are safe for you. General instructions Take over-the-counter and prescription medicines only as told by your health care provider. Do not use any products that contain nicotine or tobacco, such as cigarettes and e-cigarettes. If you need help quitting, ask your health care provider. You may shower after 24 hours, but Do not take baths, swim, or use a hot tub for 1 week.  Do not drink alcohol for 24 hours after your procedure. Keep all follow-up visits as told by your health care provider. This is important. Contact a health care provider if:  You have redness, mild swelling, or pain around your puncture site. You have fluid or blood coming from your puncture site that stops after applying firm pressure to the area. Your puncture site feels warm to the touch. You have pus or a bad smell coming from your puncture site. You have a fever. You have chest pain or discomfort that spreads to your neck, jaw, or arm. You have chest pain that is worse with lying on your back or taking a deep breath. You are sweating a lot. You feel nauseous. You have a fast or irregular heartbeat. You have shortness of breath. You are dizzy or light-headed and feel the need to lie down. You have pain or numbness in the arm or leg closest to your puncture site. Get help right away if: Your puncture site suddenly swells. Your puncture site is bleeding and the bleeding does not stop after applying firm pressure to the area. These symptoms may represent a serious problem that is an emergency. Do not wait to see if the symptoms will go away. Get medical help right away. Call your local emergency services (911 in the [place] Do not  drive yourself to the hospital. Summary After the procedure, it is normal to have bruising and tenderness at the puncture site in your groin, neck, or forearm. Check your puncture site every day for signs of infection. Get help right away if your puncture site is bleeding and the bleeding does not stop after applying firm pressure to the area. This is a medical emergency. This information is not intended to replace advice given to you by your health care provider. Make sure you discuss any questions you have with your health care provider.

## 2023-09-26 NOTE — Progress Notes (Signed)
 Electrophysiology Office Note:   Date:  09/26/2023  ID:  Tien, Aispuro Aug 30, 1940, MRN [id number]  Primary Cardiologist: Newman JINNY Lawrence, MD Primary Heart Failure: None Electrophysiologist: Tiawanna Luchsinger Gladis Norton, MD      History of Present Illness:   Stephanie Ingram is a 83 y.o. female with h/o hyperlipidemia, atrial fibrillation seen today for  for Electrophysiology evaluation of atrial fibrillation at the request of Manish Patwardhan.    Today, denies symptoms of chest pain, dyspnea, orthopnea, PND, lower extremity edema, claudication, dizziness, presyncope, syncope, bleeding, or neurologic sequela. The patient is tolerating medications without difficulties.  She has been having more frequent episodes of palpitations.  She takes metoprolol  on a daily basis, but has been having to take an extra half due to palpitations.  She is in atrial fibrillation today.  She thinks that her atrial fibrillation has gotten worse over the last few months.  She burned her finger in April, and has noted more frequent palpitations then.  She feels significant fatigue, weakness, shortness of breath.  She wore a cardiac monitor that showed a 21% burden of atrial fibrillation.  Review of systems complete and found to be negative unless listed in HPI.   EP Information / Studies Reviewed:    EKG is ordered today. Personal review as below.  EKG Interpretation Date/Time:  Tuesday September 26 2023 08:21:53 EDT Ventricular Rate:  145 PR Interval:    QRS Duration:  72 QT Interval:  294 QTC Calculation: 456 R Axis:   71  Text Interpretation: Atrial fibrillation with rapid ventricular response ST & T wave abnormality, consider inferior ischemia When compared with ECG of 07-Mar-2023 15:27, Atrial fibrillation has replaced Sinus rhythm Confirmed by Rexine Gowens ([id number]) on 09/26/2023 8:27:51 AM     Risk Assessment/Calculations:    CHA2DS2-VASc Score = 3   This indicates a 3.2% annual risk of stroke. The  patient's score is based upon: CHF History: 0 HTN History: 0 Diabetes History: 0 Stroke History: 0 Vascular Disease History: 0 Age Score: 2 Gender Score: 1            Physical Exam:   VS:  BP (!) 146/92 (BP Location: Right Arm, Patient Position: Sitting, Cuff Size: Normal)   Pulse (!) 145   Ht 5' 5 (1.651 m)   Wt 149 lb (67.6 kg)   SpO2 94%   BMI 24.79 kg/m    Wt Readings from Last 3 Encounters:  09/26/23 149 lb (67.6 kg)  09/06/23 149 lb 9.6 oz (67.9 kg)  03/07/23 149 lb 9.6 oz (67.9 kg)     GEN: Well nourished, well developed in no acute distress NECK: No JVD; No carotid bruits CARDIAC: Irregularly irregular rate and rhythm, no murmurs, rubs, gallops RESPIRATORY:  Clear to auscultation without rales, wheezing or rhonchi  ABDOMEN: Soft, non-tender, non-distended EXTREMITIES:  No edema; No deformity   ASSESSMENT AND PLAN:    1.  Paroxysmal atrial fibrillation: Recent Zio patch with an increased burden of atrial fibrillation and flutter.  She feels quite poorly when she is in atrial fibrillation with weakness, fatigue, shortness of breath.  She would prefer a rhythm control strategy.  She would like to avoid long-term antiarrhythmics.  Due to that, plan for ablation.  In the interim prior to ablation, we Sonia Bromell start flecainide 50 mg twice daily.  If she does not convert to sinus rhythm, she may require cardioversion.  Risk, benefits, and alternatives to EP study and radiofrequency/pulse field ablation for afib were also  discussed in detail today. These risks include but are not limited to stroke, bleeding, vascular damage, tamponade, perforation, damage to the esophagus, lungs, and other structures, pulmonary vein stenosis, worsening renal function, and death. The patient understands these risk and wishes to proceed.  We Bridgit Eynon therefore proceed with catheter ablation at the next available time.  Carto, ICE, anesthesia are requested for the procedure.  Donat Humble also obtain CT PV  protocol prior to the procedure to exclude LAA thrombus and further evaluate atrial anatomy.  2.  Secondary hypercoagulable state: On Eliquis  3.  Hyperlipidemia: Continue Crestor per primary cardiology  Follow up with Afib Clinic as usual post procedure  Signed, Bunny Kleist Gladis Norton, MD

## 2023-10-11 ENCOUNTER — Ambulatory Visit: Attending: Cardiovascular Disease | Admitting: Emergency Medicine

## 2023-10-11 VITALS — BP 148/68 | HR 54 | Ht 64.0 in | Wt 147.6 lb

## 2023-10-11 DIAGNOSIS — Z79899 Other long term (current) drug therapy: Secondary | ICD-10-CM | POA: Diagnosis not present

## 2023-10-11 DIAGNOSIS — Z5181 Encounter for therapeutic drug level monitoring: Secondary | ICD-10-CM | POA: Diagnosis not present

## 2023-10-11 DIAGNOSIS — I480 Paroxysmal atrial fibrillation: Secondary | ICD-10-CM | POA: Diagnosis not present

## 2023-10-11 NOTE — Progress Notes (Signed)
   Nurse Visit   Date of Encounter: 10/11/2023 ID: Margretta CHRISTELLA Rummer, DOB Jul 12, 1940, MRN [id number]  PCP:  Lazoff, Shawn P, DO   [facility] Providers Cardiologist:  Newman JINNY Lawrence, MD Electrophysiologist:  Soyla Gladis Norton, MD      Visit Details   VS:  BP (!) 170/72 (BP Location: Right Arm, Patient Position: Sitting)   Pulse (!) 54   Ht 5' 4 (1.626 m)   Wt 147 lb 9.6 oz (67 kg)   SpO2 93%   BMI 25.34 kg/m  , BMI Body mass index is 25.34 kg/m.  Wt Readings from Last 3 Encounters:  10/11/23 147 lb 9.6 oz (67 kg)  09/26/23 149 lb (67.6 kg)  09/06/23 149 lb 9.6 oz (67.9 kg)    Second BP= 148/68  Reason for visit: EKG Performed today: Vitals, EKG, Provider consulted:Dr Delford, and Education Changes (medications, testing, etc.) : No changes Length of Visit: 30 minutes    Medications Adjustments/Labs and Tests Ordered: Orders Placed This Encounter  Procedures   EKG 12-Lead   No orders of the defined types were placed in this encounter.    Signed, Comer LITTIE Ebbs, RN  10/11/2023 11:07 AM

## 2023-11-08 ENCOUNTER — Telehealth: Payer: Self-pay

## 2023-11-08 DIAGNOSIS — I480 Paroxysmal atrial fibrillation: Secondary | ICD-10-CM

## 2023-11-08 NOTE — Telephone Encounter (Signed)
 Spoke with the patient and scheduled her for an ablation with Dr. Inocencio on 10/15. She will have labs drawn on 10/01. Instructions will be mailed to her. She will call us  back with any further questions.

## 2023-12-20 ENCOUNTER — Telehealth (HOSPITAL_COMMUNITY): Payer: Self-pay

## 2023-12-20 NOTE — Telephone Encounter (Signed)
 Spoke with patient to complete pre-procedure call.     Health status review:  Any new medical conditions, recent signs of acute illness or been started on antibiotics? No Any recent hospitalizations or surgeries? No Any new medications started since pre-op visit? No  Follow all medication instructions prior to procedure or the procedure may be rescheduled:    Continue taking Xarelto (Rivaroxaban) once daily without missing any doses before procedure. Essential chronic medications:  No medication should be continued, unless told otherwise. On the morning of your procedure DO NOT take any medication., including Xarelto (Rivaroxaban).  Nothing to eat or drink after midnight prior to your procedure.  Pre-procedure testing scheduled: lab work by October 1.  Confirmed patient is scheduled for Atrial Fibrillation Ablation on Wednesday, October 15 with Dr. Soyla Norton. Instructed patient to arrive at the Main Entrance A at [facility] [address] [place], [place] [address] and check in at Admitting at 6:30 AM.  Advised of plan to go home the same day and will only stay overnight if medically necessary. You MUST have a responsible adult to drive you home and MUST be with you the first 24 hours after you arrive home or your procedure could be cancelled.  Informed patient a nurse will call a day before the procedure to confirm arrival time and ensure instructions are followed.  Patient verbalized understanding to information provided and is agreeable to proceed with procedure.   Advised patient to contact RN Navigator at [phone], to inform of any new medications started after call or concerns prior to procedure.

## 2024-01-03 LAB — CBC

## 2024-01-04 LAB — BASIC METABOLIC PANEL WITH GFR
BUN/Creatinine Ratio: 18 (ref 12–28)
BUN: 14 mg/dL (ref 8–27)
CO2: 22 mmol/L (ref 20–29)
Calcium: 9.5 mg/dL (ref 8.7–10.3)
Chloride: 103 mmol/L (ref 96–106)
Creatinine, Ser: 0.8 mg/dL (ref 0.57–1.00)
Glucose: 94 mg/dL (ref 70–99)
Potassium: 4.7 mmol/L (ref 3.5–5.2)
Sodium: 142 mmol/L (ref 134–144)
eGFR: 73 mL/min/1.73 (ref >59)

## 2024-01-04 LAB — CBC
Hematocrit: 44.7 % (ref 34.0–46.6)
Hemoglobin: 14.4 g/dL (ref 11.1–15.9)
MCH: 31.2 pg (ref 26.6–33.0)
MCHC: 32.2 g/dL (ref 31.5–35.7)
MCV: 97 fL (ref 79–97)
Platelets: 228 x10E3/uL (ref 150–450)
RBC: 4.61 x10E6/uL (ref 3.77–5.28)
RDW: 12.8 % (ref 11.7–15.4)
WBC: 5.5 x10E3/uL (ref 3.4–10.8)

## 2024-01-16 ENCOUNTER — Telehealth: Payer: Self-pay | Admitting: Cardiology

## 2024-01-16 NOTE — Telephone Encounter (Signed)
 Son advised of medications to take/hold.  Aware NPO after MN tonight, hold ALL medications tomorrow morning.  Son appreciates my follow up call.

## 2024-01-16 NOTE — Telephone Encounter (Signed)
 Pt's son would like a c/b regarding what medications pt is able to take prior to procedure in the morning. Please advise

## 2024-01-16 NOTE — Pre-Procedure Instructions (Signed)
 Instructed patient on the following items: Arrival time 0615 Nothing to eat or drink after midnight No meds AM of procedure Responsible person to drive you home and stay with you for 24 hrs  Have you missed any doses of anti-coagulant Xarelto-  takes once a day, hasn't missed any doses in last 4 weeks.   Patient asked about getting flu shot today.  Instructed her to wait till after ablation.

## 2024-01-17 ENCOUNTER — Encounter (HOSPITAL_COMMUNITY): Admission: RE | Payer: Self-pay | Source: Home / Self Care

## 2024-01-17 ENCOUNTER — Ambulatory Visit (HOSPITAL_COMMUNITY): Admission: RE | Admit: 2024-01-17 | Source: Home / Self Care | Admitting: Cardiology

## 2024-01-17 SURGERY — ATRIAL FIBRILLATION ABLATION
Anesthesia: General

## 2024-01-17 NOTE — Progress Notes (Signed)
 Patient's husband called this morning and said patient is sick and not coming this am.   Will send message to office to reschedule.

## 2024-01-22 ENCOUNTER — Telehealth: Payer: Self-pay | Admitting: Cardiology

## 2024-01-22 NOTE — Telephone Encounter (Signed)
 Adfsdf Pt would like to reschedule her ablation to 04/09/24.  Aware office will send instructions via mychart. Patient verbalized understanding and agreeable to plan.   Pt aware she will need follow up visit prior to being that she has not been seen since June.  Aware office will contact her to arrange OV in November/December.  Pt agreeable

## 2024-01-22 NOTE — Telephone Encounter (Signed)
 Pt contacted and she reports that she would like to move forward with rescheduling her ablation. Pt did not request any specific date, but she would like the sooner the better.

## 2024-01-22 NOTE — Telephone Encounter (Signed)
 Call x1; lvmtcb + MyChart message sent to patient, to schedule patient for Nov/Dec w/ Camnitz for f/u prior to 1/6 ablation since LOV was in June.

## 2024-01-22 NOTE — Telephone Encounter (Signed)
 Per pt message: Rescheduled Heart operation

## 2024-01-25 NOTE — Progress Notes (Signed)
 [facility] [place]  Return Patient Visit Stephanie Ingram DOB: 1940-08-01  MRN: [id number] Visit Date: 01/25/2024  Encounter Provider: Gustav Almarie Pack, FNP  Subjective Stephanie Ingram is a 83 y.o. female who presents for dizziness  History of Present Illness The patient presents for evaluation of dizziness.She reports experiencing dizziness, which she believes started after her visit to urgent care approximately 2 weeks ago. During that visit, she was diagnosed with influenza and prescribed Doxycycline and Albuterol. She did not complete the entire therapy of doxycycline due to it making her feel bad. She took 9/10 days. She has previously taken doxycycline twice without experiencing dizziness. She also received an albuterol inhaler, which she has been using 3 to 4 times daily, with the last use being 1 or 2 days ago. She continues to experience lightheadedness, particularly when walking around, but reports no shortness of breath, chest pain, vision changes, or chest tightness. She has not experienced any fainting episodes.   Her diet consists of two meals a day, and she consumes half a gallon of water  daily. She was initially seen at urgent care for shortness of breath and a persistent cough, which has since improved.    PAST SURGICAL HISTORY: She has a procedure scheduled for her heart in early January for atrial fibrillation.  Review of Systems  Constitutional:  Negative for chills, fatigue and fever.  HENT:  Negative for congestion, ear discharge, ear pain, postnasal drip, rhinorrhea, sinus pressure, sinus pain and sore throat.   Respiratory:  Positive for cough. Negative for chest tightness, shortness of breath and wheezing.   Cardiovascular:  Negative for chest pain.  Neurological:  Positive for dizziness, light-headedness and headaches. Negative for syncope and weakness.     Behavioral Health Screening  Patient Health Questionnaire-2 Score: 0  (01/25/2024  9:29 AM)      Patient's Depression screening is Negative   Depression Plan: Normal/Negative Screening  Objective Blood pressure 149/73, pulse 68, temperature 98.6 F (37 C), height 1.651 m (5' 5), weight 68.7 kg (151 lb 6.4 oz), SpO2 94%, not currently breastfeeding. Physical Exam   Physical Exam Constitutional:      Appearance: Normal appearance.  HENT:     Right Ear: Hearing normal. No swelling or tenderness. A middle ear effusion is present. There is no impacted cerumen. Tympanic membrane is not erythematous or bulging.     Left Ear: Hearing normal. No swelling or tenderness. A middle ear effusion is present. There is no impacted cerumen. Tympanic membrane is not erythematous or bulging.     Nose: Nose normal. No congestion or rhinorrhea.     Mouth/Throat:     Mouth: Mucous membranes are moist.     Pharynx: Oropharynx is clear. No oropharyngeal exudate or posterior oropharyngeal erythema.  Cardiovascular:     Rate and Rhythm: Normal rate and regular rhythm.     Pulses: Normal pulses.     Heart sounds: Normal heart sounds. No murmur heard. Pulmonary:     Effort: Pulmonary effort is normal. No respiratory distress.     Breath sounds: Normal breath sounds. No stridor. No wheezing or rhonchi.  Neurological:     General: No focal deficit present.     Mental Status: She is alert and oriented to person, place, and time.  Psychiatric:        Mood and Affect: Mood normal.        Behavior: Behavior normal.    Medical History: Medical History[1]  Patient Active Problem List  Diagnosis Date Noted  . Diverticulosis of colon 03/31/2021  . Hemorrhage of anus and rectum 03/31/2021  . Health maintenance examination 02/06/2019  . Blood loss anemia 02/06/2019  . Arthralgia of multiple joints 07/07/2017  . Urine frequency 11/30/2016  . Dysuria 09/17/2016  . Plantar fasciitis 09/17/2016  . Allergic drug reaction 05/16/2016  . Deviated nasal septum 05/16/2016  .  Chronic bilateral low back pain without sciatica 03/24/2016  . Right hip pain 10/10/2015  . S/P lumbar discectomy 06/29/2015  . Bilateral hand numbness 05/14/2015  . Fatigue 05/14/2015  . GERD (gastroesophageal reflux disease) 05/14/2015  . HLD (hyperlipidemia) 05/14/2015  . Myalgia 05/14/2015  . Nocturnal leg cramps 05/14/2015  . Osteoporosis 05/14/2015  . Sciatica 05/14/2015   Current Medications:  Medications Ordered Prior to Encounter[2] Current Medications[3]  Allergies: Allergies[4]   Immunizations:  Immunization History  Administered Date(s) Administered  . Influenza, High-dose Seasonal, Quadrivalent, Preservative Free 12/26/2018, 03/03/2020, 01/06/2021, 01/07/2022  . Influenza, high-dose, trivalent, PF 02/05/2015, 03/24/2016, 01/11/2017, 01/12/2018, 01/24/2023  . Moderna Covid-19, mRNA,LNP-S,PF 12+ Yrs 03/22/2022, 04/04/2023  . Moderna SARS-CoV-2 Primary Series 12+ yrs 06/06/2019, 07/12/2019  . Pfizer SARS-CoV-2 Bivalent 12+ yrs 04/07/2021  . Pneumococcal Conjugate 13-Valent 01/11/2017  . Pneumococcal Polysaccharide Vaccine, 23 Valent (PNEUMOVAX-23) 2Y+ 07/28/2008  . TDAP VACCINE (BOOSTRIX,ADACEL) 7Y+ 08/04/2005, 04/02/2023    Surgical History- Surgical History[5]  Family history- Family History[6] Social history- Social History[7]   Labs: No results found for this or any previous visit (from the past week).    Assessment & Plan 1. Fluid level behind tympanic membrane of both ears (Primary) - fluticasone propionate (FLONASE) 50 mcg/spray nasal spray; Administer 2 sprays into each nostril daily.  Dispense: 16 g; Refill: 1 - cetirizine (ZyrTEC) 1 mg/mL syrup; Take 5 mL (5 mg total) by mouth daily.  Dispense: 150 mL; Refill: 0  The dizziness is likely not related to the antibiotic doxycycline, as she has tolerated it well in the past. She reports feeling dizzy mainly when walking around. There is no associated shortness of breath, chest pain, or chest tightness. On  exam, fluid is noted on both ears. The left is greater than the right. This could very well be contributing to her symptoms. She will begin using Flonase and Zyrtec daily. I provided her with instructions on how to administer the Flonase. We also discussed how to only use the Albuterol as needed.      Problem List Items Addressed This Visit   None Visit Diagnoses       Fluid level behind tympanic membrane of both ears    -  Primary   Relevant Medications   fluticasone propionate (FLONASE) 50 mcg/spray nasal spray   cetirizine (ZyrTEC) 1 mg/mL syrup      Please contact my office for worsening conditions or problems, and seek emergency medical treatment and/or call 911 if you or your family deems either necessary.  Patient Instructions  I sent a prescription for Flonase to the pharmacy. Please do two sprays in each nostril once a day. When using Flonase, tilt your head slightly forward, then insert the nozzle into your nostril as far as it will comfortably go. Point the nozzle slightly toward your ear rather than straight up in order to make it into your Eustachian tube where the medicine is needed.    Return if symptoms worsen or fail to improve.  Portions of this note were created using DAX Copilot software. Although proofread, errors may be present.  I have personally spent 20 minutes  involved in face-to-face and non-face-to-face activities for this patient on the day of the visit.  Professional time spent includes the following activities, in addition to those noted in the documentation:  - preparing to see the patient (e.g., review of recent and/or remote lab/imaging/study results, provider notes, and patient messages/phone calls available in current EMR, CareEverywhere, and scanned records) -obtaining and/or reviewing separately obtained history either through past provider notes, patient phone calls, and/or patient's family member(s)/caregiver(s) -performing a medically appropriate  examination and/or evaluation -counseling and educating the patient/family/caregiver -ordering medications, tests, or procedures -documenting clinical information in the electronic or other health record -reviewing most up to date studies or expert consensus guidelines for screening/diagnosing/treating pertinent conditions/symptoms -independently interpreting results (not separately reported) and communicating results to the patient/family/caregiver -care coordination (not separately reported) -referring and communicating with other health care professionals (when not separately reported)  Gustav Almarie Pack, FNP 10:11 AM       [1] Past Medical History: Diagnosis Date  . Acute GI bleeding 08/27/2018    March 2020. Hospitalized for melena. Source undetermined. NSAIDs stopped.  . Benign neoplasm of skin of trunk 03/24/2016  . GERD (gastroesophageal reflux disease)   . HLD (hyperlipidemia) 05/14/2015  . Hypercholesterolemia   . Osteoporosis 05/14/2015  [2] Current Outpatient Medications on File Prior to Visit  Medication Sig Dispense Refill  . acetaminophen  (TYLENOL ) 500 mg tablet Take 500 mg by mouth every 6 (six) hours as needed (pain).    SABRA albuterol HFA (PROVENTIL HFA;VENTOLIN HFA;PROAIR HFA) 90 mcg/actuation inhaler Inhale 2 puffs every 6 (six) hours as needed for wheezing or shortness of breath. 18 g 0  . benzonatate (TESSALON) 100 mg capsule Take 1 capsule (100 mg total) by mouth 3 (three) times a day as needed for cough. 30 capsule 0  . calcium carbonate-vitamin D3 (OS-CAL + D) 600 mg-10 mcg (400 unit) tablet Take 1 tablet by mouth Once Daily.    . carboxymethylcellulose (REFRESH TEARS) 0.5 % drop ophthalmic solution Administer into both eyes as needed for dry eyes.    . cranberry fruit extract (cranberry extract) 500 mg tab Take 1,000 mg by mouth Once Daily.    . flecainide  (TAMBOCOR ) 50 mg tablet Take 50 mg by mouth 2 (two) times a day.    . metoprolol  succinate (TOPROL  XL)  25 mg 24 hr tablet Take 1/2 (one-half) tablet by mouth once daily 45 tablet 0  . multivit-minerals-folic acid-lycopen-lutein (CertaVite Senior) 0.4 mg-300 mcg- 250 mcg tab tablet Take 1 tablet by mouth Once Daily.    . rosuvastatin (CRESTOR) 10 mg tablet TAKE 1 TABLET EVERY DAY 90 tablet 3  . Xarelto 20 mg tablet TAKE 1 TABLET EVERY DAY WITH DINNER 90 tablet 3  . doxycycline (VIBRA-TABS) 100 mg tablet Take 1 tablet (100 mg total) by mouth 2 (two) times a day for 10 days. Take with 8 oz water . Do not lie down for at least 30 minutes after. (Patient not taking: Reported on 01/25/2024) 20 tablet 0  . omeprazole (PriLOSEC) 40 mg DR capsule TAKE 1 CAPSULE TWICE DAILY (Patient not taking: Reported on 01/25/2024) 120 capsule 5  . polyethylene glycol (GLYCOLAX ) 17 gram packet Take 17 g by mouth daily. (Patient not taking: Reported on 01/25/2024)     No current facility-administered medications on file prior to visit.  [3]  Current Outpatient Medications:  .  acetaminophen  (TYLENOL ) 500 mg tablet, Take 500 mg by mouth every 6 (six) hours as needed (pain)., Disp: , Rfl:  .  albuterol  HFA (PROVENTIL HFA;VENTOLIN HFA;PROAIR HFA) 90 mcg/actuation inhaler, Inhale 2 puffs every 6 (six) hours as needed for wheezing or shortness of breath., Disp: 18 g, Rfl: 0 .  benzonatate (TESSALON) 100 mg capsule, Take 1 capsule (100 mg total) by mouth 3 (three) times a day as needed for cough., Disp: 30 capsule, Rfl: 0 .  calcium carbonate-vitamin D3 (OS-CAL + D) 600 mg-10 mcg (400 unit) tablet, Take 1 tablet by mouth Once Daily., Disp: , Rfl:  .  carboxymethylcellulose (REFRESH TEARS) 0.5 % drop ophthalmic solution, Administer into both eyes as needed for dry eyes., Disp: , Rfl:  .  cranberry fruit extract (cranberry extract) 500 mg tab, Take 1,000 mg by mouth Once Daily., Disp: , Rfl:  .  flecainide  (TAMBOCOR ) 50 mg tablet, Take 50 mg by mouth 2 (two) times a day., Disp: , Rfl:  .  metoprolol  succinate (TOPROL  XL) 25 mg 24  hr tablet, Take 1/2 (one-half) tablet by mouth once daily, Disp: 45 tablet, Rfl: 0 .  multivit-minerals-folic acid-lycopen-lutein (CertaVite Senior) 0.4 mg-300 mcg- 250 mcg tab tablet, Take 1 tablet by mouth Once Daily., Disp: , Rfl:  .  rosuvastatin (CRESTOR) 10 mg tablet, TAKE 1 TABLET EVERY DAY, Disp: 90 tablet, Rfl: 3 .  Xarelto 20 mg tablet, TAKE 1 TABLET EVERY DAY WITH DINNER, Disp: 90 tablet, Rfl: 3 .  cetirizine (ZyrTEC) 1 mg/mL syrup, Take 5 mL (5 mg total) by mouth daily., Disp: 150 mL, Rfl: 0 .  doxycycline (VIBRA-TABS) 100 mg tablet, Take 1 tablet (100 mg total) by mouth 2 (two) times a day for 10 days. Take with 8 oz water . Do not lie down for at least 30 minutes after. (Patient not taking: Reported on 01/25/2024), Disp: 20 tablet, Rfl: 0 .  fluticasone propionate (FLONASE) 50 mcg/spray nasal spray, Administer 2 sprays into each nostril daily., Disp: 16 g, Rfl: 1 .  omeprazole (PriLOSEC) 40 mg DR capsule, TAKE 1 CAPSULE TWICE DAILY (Patient not taking: Reported on 01/25/2024), Disp: 120 capsule, Rfl: 5 .  polyethylene glycol (GLYCOLAX ) 17 gram packet, Take 17 g by mouth daily. (Patient not taking: Reported on 01/25/2024), Disp: , Rfl:  [4] Allergies Allergen Reactions  . Nitrofurantoin Monohyd/M-Cryst Shortness Of Breath  . Alendronate Hives    Swelling of face and lips.  . Ciprofloxacin GI Intolerance  . Denosumab Hives and Angioedema  . Ibandronate Hives and Angioedema  . Keflex  [Cephalexin ]     Did not like the way it made her feel.   . Sulfamethoxazole -Trimethoprim  Other (See Comments)    Unspecified  [5] Past Surgical History: Procedure Laterality Date  . BACK SURGERY     Procedure: BACK SURGERY  . CATARACT EXTRACTION    [6] Family History Problem Relation Name Age of Onset  . Cancer Father    . Breast cancer Neg Hx    [7] Social History Socioeconomic History  . Marital status: Divorced  Tobacco Use  . Smoking status: Never    Passive exposure: Current  .  Smokeless tobacco: Never  Substance and Sexual Activity  . Alcohol use: Not Currently  . Drug use: Never   Social Drivers of Health   Food Insecurity: Low Risk  (01/25/2024)   Food vital sign   . Within the past 12 months, you worried that your food would run out before you got money to buy more: Never true   . Within the past 12 months, the food you bought just didn't last and you didn't have money to get  more: Never true  Transportation Needs: No Transportation Needs (01/25/2024)   Transportation   . In the past 12 months, has lack of reliable transportation kept you from medical appointments, meetings, work or from getting things needed for daily living? : No  Safety: Low Risk  (01/25/2024)   Safety   . How often does anyone, including family and friends, physically hurt you?: Never   . How often does anyone, including family and friends, insult or talk down to you?: Never   . How often does anyone, including family and friends, threaten you with harm?: Never   . How often does anyone, including family and friends, scream or curse at you?: Never  Living Situation: Low Risk  (01/25/2024)   Living Situation   . What is your living situation today?: I have a steady place to live   . Think about the place you live. Do you have problems with any of the following? Choose all that apply:: None/None on this list

## 2024-02-02 ENCOUNTER — Other Ambulatory Visit: Payer: Self-pay

## 2024-02-02 DIAGNOSIS — I480 Paroxysmal atrial fibrillation: Secondary | ICD-10-CM

## 2024-03-06 ENCOUNTER — Telehealth: Payer: Self-pay

## 2024-03-06 NOTE — Telephone Encounter (Signed)
-----   Message from Nurse Sherri P sent at 02/05/2024  5:52 PM EST ----- Regarding: 04/09/24  AFib Ablation Precert:  MD: Camnitz Type of ablation: A-fib Diagnosis: tachycardia CPT code: A-fib (06343) Ablation scheduled (date/time): 04/09/24  11:30 am  Procedure:  Added to calendar? Yes Orders entered? Yes Letter complete? No, >30 days before procedure Scheduled with cath lab? Yes Any medications to hold? Yes (please list hold instructions): routine Labs ordered (CBC, BMET, PT/INR if on warfarin): No  (will order/obtain at 12/17 OV) Mapping system: CARTO (lab 4 or 6) CARTO/OPAL rep notified? No Cardiac CT needed? No Dye allergy? N/a Pre-meds ordered and instructions given? N/A Letter method: MyChart H&P: 12/17 Device: No  Follow-up:  Cassie/Angel, please schedule Routine.

## 2024-03-06 NOTE — Telephone Encounter (Signed)
 Pt will complete labs at her o/v on 12/17.

## 2024-03-19 ENCOUNTER — Other Ambulatory Visit: Payer: Self-pay

## 2024-03-19 ENCOUNTER — Encounter (HOSPITAL_COMMUNITY): Payer: Self-pay

## 2024-03-19 ENCOUNTER — Telehealth (HOSPITAL_COMMUNITY): Payer: Self-pay

## 2024-03-19 NOTE — Progress Notes (Unsigned)
 Electrophysiology Office Note:   Date:  03/20/2024  ID:  Stephanie, Ingram [birth year], MRN [id number]  Primary Cardiologist: Newman JINNY Lawrence, MD Primary Heart Failure: None Electrophysiologist: Murel Wigle Gladis Norton, MD      History of Present Illness:   Stephanie Ingram is a 82 y.o. female with h/o atrial fibrillation, hyperlipidemia seen today for routine electrophysiology followup.   Since last being seen in our clinic the patient reports doing overall well from a cardiac perspective.  Her main issue is orthopedic.  She is having issues with pain in her shoulders and in her legs.  She has not noted any further episodes of atrial fibrillation.  Despite this, she continues to want her flecainide  to be a short-term medication..  she denies chest pain, palpitations, dyspnea, PND, orthopnea, nausea, vomiting, dizziness, syncope, edema, weight gain, or early satiety.   Review of systems complete and found to be negative unless listed in HPI.   EP Information / Studies Reviewed:    EKG is ordered today. Personal review as below.  EKG Interpretation Date/Time:  Wednesday March 20 2024 10:24:04 EST Ventricular Rate:  67 PR Interval:  186 QRS Duration:  76 QT Interval:  392 QTC Calculation: 414 R Axis:   73  Text Interpretation: Normal sinus rhythm Low voltage QRS When compared with ECG of 26-Sep-2023 08:21, Sinus rhythm has replaced Atrial fibrillation Vent. rate has decreased BY  78 BPM Confirmed by Mickie Badders ([id number]) on 03/20/2024 10:48:00 AM     Risk Assessment/Calculations:    CHA2DS2-VASc Score = 3   This indicates a 3.2% annual risk of stroke. The patient's score is based upon: CHF History: 0 HTN History: 0 Diabetes History: 0 Stroke History: 0 Vascular Disease History: 0 Age Score: 2 Gender Score: 1            Physical Exam:   VS:  BP 104/70 (BP Location: Right Arm, Patient Position: Sitting, Cuff Size: Normal)   Pulse 67   Ht 5' 4 (1.626 m)   Wt  148 lb (67.1 kg)   SpO2 94%   BMI 25.40 kg/m    Wt Readings from Last 3 Encounters:  03/20/24 148 lb (67.1 kg)  10/11/23 147 lb 9.6 oz (67 kg)  09/26/23 149 lb (67.6 kg)     GEN: Well nourished, well developed in no acute distress NECK: No JVD; No carotid bruits CARDIAC: Regular rate and rhythm, no murmurs, rubs, gallops RESPIRATORY:  Clear to auscultation without rales, wheezing or rhonchi  ABDOMEN: Soft, non-tender, non-distended EXTREMITIES:  No edema; No deformity   ASSESSMENT AND PLAN:    1.  Paroxysmal atrial fibrillation, flutter: Zio patch with an increased burden of both atrial fibrillation and flutter.  On flecainide .  Scheduled for ablation 04/10/2023.  Risks and benefits of ablation of previously been discussed.  She understands the risks and has agreed to the procedure.  Risk, benefits, and alternatives to EP study and radiofrequency/pulse field ablation for afib were also discussed in detail today. These risks include but are not limited to stroke, bleeding, vascular damage, tamponade, perforation, damage to the esophagus, lungs, and other structures, pulmonary vein stenosis, worsening renal function, and death. The patient understands these risk and wishes to proceed.  We [name] therefore proceed with catheter ablation at the next available time.  Carto, ICE, anesthesia are requested for the procedure.  This patient [name] NOT require CT prior to ablation  2.  Secondary hypercoagulable date: On Eliquis  3.  Hyperlipidemia: Continue Crestor  4.  High risk medication monitoring: Patient on flecainide .  QRS remains narrow.  Follow up with EP Team as usual post procedure  Signed, Demian Maisel Gladis Norton, MD

## 2024-03-19 NOTE — Telephone Encounter (Signed)
 Spoke with patient to complete pre-procedure call.     Health status review:  Any new medical conditions, recent signs of acute illness or been started on antibiotics? Yes; Patient feels like she has a recurrent UTI and plans to contact her PCP office.  Any recent hospitalizations or surgeries? No Any new medications started since pre-op visit? No  Follow all medication instructions prior to procedure or the procedure may be rescheduled:    Continue taking Xarelto (Rivaroxaban) once daily without missing any doses before procedure. Essential chronic medications:  No medication should be continued, unless told otherwise. On the morning of your procedure DO NOT take any medication., including Xarelto (Rivaroxaban).  Nothing to eat or drink after midnight prior to your procedure.  Pre-procedure testing scheduled: CT not required and lab work by  December 23.  Confirmed patient is scheduled for Atrial Fibrillation Ablation on Tuesday, January 6 with Dr. Inocencio. Instructed patient to arrive at the Main Entrance A at [facility]: [address] [place], [place] [address] and check in at Admitting at 9:30 AM.  Plan to go home the same day, you will only stay overnight if medically necessary. You MUST have a responsible adult to drive you home and MUST be with you the first 24 hours after you arrive home or your procedure could be cancelled.  Informed a nurse may call a day before the procedure to confirm arrival time and ensure instructions are followed.  Patient verbalized understanding to information provided and is agreeable to proceed with procedure.   Advised to contact RN Navigator at [phone], to inform of any new medications started after call or concerns prior to procedure.

## 2024-03-19 NOTE — H&P (View-Only) (Signed)
 Electrophysiology Office Note:   Date:  03/20/2024  ID:  Cheril, Slattery [birth year], MRN [id number]  Primary Cardiologist: Newman JINNY Lawrence, MD Primary Heart Failure: None Electrophysiologist: Murel Wigle Gladis Norton, MD      History of Present Illness:   Stephanie Ingram is a 82 y.o. female with h/o atrial fibrillation, hyperlipidemia seen today for routine electrophysiology followup.   Since last being seen in our clinic the patient reports doing overall well from a cardiac perspective.  Her main issue is orthopedic.  She is having issues with pain in her shoulders and in her legs.  She has not noted any further episodes of atrial fibrillation.  Despite this, she continues to want her flecainide  to be a short-term medication..  she denies chest pain, palpitations, dyspnea, PND, orthopnea, nausea, vomiting, dizziness, syncope, edema, weight gain, or early satiety.   Review of systems complete and found to be negative unless listed in HPI.   EP Information / Studies Reviewed:    EKG is ordered today. Personal review as below.  EKG Interpretation Date/Time:  Wednesday March 20 2024 10:24:04 EST Ventricular Rate:  67 PR Interval:  186 QRS Duration:  76 QT Interval:  392 QTC Calculation: 414 R Axis:   73  Text Interpretation: Normal sinus rhythm Low voltage QRS When compared with ECG of 26-Sep-2023 08:21, Sinus rhythm has replaced Atrial fibrillation Vent. rate has decreased BY  78 BPM Confirmed by Mickie Badders ([id number]) on 03/20/2024 10:48:00 AM     Risk Assessment/Calculations:    CHA2DS2-VASc Score = 3   This indicates a 3.2% annual risk of stroke. The patient's score is based upon: CHF History: 0 HTN History: 0 Diabetes History: 0 Stroke History: 0 Vascular Disease History: 0 Age Score: 2 Gender Score: 1            Physical Exam:   VS:  BP 104/70 (BP Location: Right Arm, Patient Position: Sitting, Cuff Size: Normal)   Pulse 67   Ht 5' 4 (1.626 m)   Wt  148 lb (67.1 kg)   SpO2 94%   BMI 25.40 kg/m    Wt Readings from Last 3 Encounters:  03/20/24 148 lb (67.1 kg)  10/11/23 147 lb 9.6 oz (67 kg)  09/26/23 149 lb (67.6 kg)     GEN: Well nourished, well developed in no acute distress NECK: No JVD; No carotid bruits CARDIAC: Regular rate and rhythm, no murmurs, rubs, gallops RESPIRATORY:  Clear to auscultation without rales, wheezing or rhonchi  ABDOMEN: Soft, non-tender, non-distended EXTREMITIES:  No edema; No deformity   ASSESSMENT AND PLAN:    1.  Paroxysmal atrial fibrillation, flutter: Zio patch with an increased burden of both atrial fibrillation and flutter.  On flecainide .  Scheduled for ablation 04/10/2023.  Risks and benefits of ablation of previously been discussed.  She understands the risks and has agreed to the procedure.  Risk, benefits, and alternatives to EP study and radiofrequency/pulse field ablation for afib were also discussed in detail today. These risks include but are not limited to stroke, bleeding, vascular damage, tamponade, perforation, damage to the esophagus, lungs, and other structures, pulmonary vein stenosis, worsening renal function, and death. The patient understands these risk and wishes to proceed.  We [name] therefore proceed with catheter ablation at the next available time.  Carto, ICE, anesthesia are requested for the procedure.  This patient [name] NOT require CT prior to ablation  2.  Secondary hypercoagulable date: On Eliquis  3.  Hyperlipidemia: Continue Crestor  4.  High risk medication monitoring: Patient on flecainide .  QRS remains narrow.  Follow up with EP Team as usual post procedure  Signed, Demian Maisel Gladis Norton, MD

## 2024-03-20 ENCOUNTER — Other Ambulatory Visit (HOSPITAL_COMMUNITY): Payer: Self-pay

## 2024-03-20 ENCOUNTER — Ambulatory Visit: Attending: Cardiology | Admitting: Cardiology

## 2024-03-20 ENCOUNTER — Encounter: Payer: Self-pay | Admitting: Cardiology

## 2024-03-20 VITALS — BP 104/70 | HR 67 | Ht 64.0 in | Wt 148.0 lb

## 2024-03-20 DIAGNOSIS — D6869 Other thrombophilia: Secondary | ICD-10-CM

## 2024-03-20 DIAGNOSIS — I480 Paroxysmal atrial fibrillation: Secondary | ICD-10-CM | POA: Diagnosis not present

## 2024-03-20 DIAGNOSIS — Z79899 Other long term (current) drug therapy: Secondary | ICD-10-CM | POA: Diagnosis not present

## 2024-03-20 DIAGNOSIS — Z5181 Encounter for therapeutic drug level monitoring: Secondary | ICD-10-CM | POA: Diagnosis not present

## 2024-03-21 LAB — BASIC METABOLIC PANEL WITH GFR
BUN/Creatinine Ratio: 14 (ref 12–28)
BUN: 11 mg/dL (ref 8–27)
CO2: 22 mmol/L (ref 20–29)
Calcium: 9.5 mg/dL (ref 8.7–10.3)
Chloride: 98 mmol/L (ref 96–106)
Creatinine, Ser: 0.81 mg/dL (ref 0.57–1.00)
Glucose: 91 mg/dL (ref 70–99)
Potassium: 4.5 mmol/L (ref 3.5–5.2)
Sodium: 140 mmol/L (ref 134–144)
eGFR: 71 mL/min/1.73 (ref >59)

## 2024-03-21 LAB — CBC
Hematocrit: 45 % (ref 34.0–46.6)
Hemoglobin: 14.6 g/dL (ref 11.1–15.9)
MCH: 31.5 pg (ref 26.6–33.0)
MCHC: 32.4 g/dL (ref 31.5–35.7)
MCV: 97 fL (ref 79–97)
Platelets: 256 x10E3/uL (ref 150–450)
RBC: 4.63 x10E6/uL (ref 3.77–5.28)
RDW: 12.4 % (ref 11.7–15.4)
WBC: 6.7 x10E3/uL (ref 3.4–10.8)

## 2024-04-02 NOTE — Telephone Encounter (Signed)
 Patient was treated for acute cystitis with hematuria on 12/16 and has completed antibiotics. She reports symptoms have resolved.

## 2024-04-08 NOTE — Pre-Procedure Instructions (Signed)
 Instructed patient on the following items: Arrival time 0930 Nothing to eat or drink after midnight No meds AM of procedure Responsible person to drive you home and stay with you for 24 hrs  Have you missed any doses of anti-coagulant Xarelto- takes once a day, hasn't missed any dose in last 4 weeks.

## 2024-04-09 ENCOUNTER — Ambulatory Visit (HOSPITAL_COMMUNITY)
Admission: RE | Disposition: A | Discharge status: Home / Self Care | Payer: Self-pay | Source: Home / Self Care | Attending: Cardiology

## 2024-04-09 ENCOUNTER — Ambulatory Visit (HOSPITAL_COMMUNITY): Admitting: Anesthesiology

## 2024-04-09 ENCOUNTER — Other Ambulatory Visit: Payer: Self-pay

## 2024-04-09 ENCOUNTER — Encounter (HOSPITAL_COMMUNITY): Payer: Self-pay | Admitting: Cardiology

## 2024-04-09 ENCOUNTER — Ambulatory Visit (HOSPITAL_COMMUNITY): Admission: RE | Admit: 2024-04-09 | Source: Home / Self Care | Admitting: Cardiology

## 2024-04-09 DIAGNOSIS — D6869 Other thrombophilia: Secondary | ICD-10-CM | POA: Diagnosis not present

## 2024-04-09 DIAGNOSIS — I10 Essential (primary) hypertension: Secondary | ICD-10-CM | POA: Diagnosis not present

## 2024-04-09 DIAGNOSIS — Z79899 Other long term (current) drug therapy: Secondary | ICD-10-CM | POA: Insufficient documentation

## 2024-04-09 DIAGNOSIS — I4891 Unspecified atrial fibrillation: Secondary | ICD-10-CM

## 2024-04-09 DIAGNOSIS — E785 Hyperlipidemia, unspecified: Secondary | ICD-10-CM | POA: Insufficient documentation

## 2024-04-09 DIAGNOSIS — I483 Typical atrial flutter: Secondary | ICD-10-CM

## 2024-04-09 DIAGNOSIS — I480 Paroxysmal atrial fibrillation: Secondary | ICD-10-CM | POA: Diagnosis present

## 2024-04-09 DIAGNOSIS — Z7901 Long term (current) use of anticoagulants: Secondary | ICD-10-CM | POA: Diagnosis not present

## 2024-04-09 HISTORY — PX: ATRIAL FIBRILLATION ABLATION: EP1191

## 2024-04-09 LAB — POCT ACTIVATED CLOTTING TIME: Activated Clotting Time: 337 s

## 2024-04-09 MED ORDER — ROCURONIUM BROMIDE 10 MG/ML (PF) SYRINGE
PREFILLED_SYRINGE | INTRAVENOUS | Status: DC | PRN
  Administered 2024-04-09: 50 mg via INTRAVENOUS

## 2024-04-09 MED ORDER — HEPARIN SODIUM (PORCINE) 1000 UNIT/ML IJ SOLN
INTRAMUSCULAR | Status: DC | PRN
  Administered 2024-04-09: 1000 [IU] via INTRAVENOUS

## 2024-04-09 MED ORDER — PROPOFOL 10 MG/ML IV BOLUS
INTRAVENOUS | Status: DC | PRN
  Administered 2024-04-09: 110 mg via INTRAVENOUS

## 2024-04-09 MED ORDER — HEPARIN SODIUM (PORCINE) 1000 UNIT/ML IJ SOLN
INTRAMUSCULAR | Status: AC
  Filled 2024-04-09: qty 10

## 2024-04-09 MED ORDER — ATROPINE SULFATE 1 MG/10ML IJ SOSY
PREFILLED_SYRINGE | INTRAMUSCULAR | Status: DC | PRN
  Administered 2024-04-09: 1 mg via INTRAVENOUS

## 2024-04-09 MED ORDER — FENTANYL CITRATE (PF) 100 MCG/2ML IJ SOLN
INTRAMUSCULAR | Status: AC
  Filled 2024-04-09: qty 2

## 2024-04-09 MED ORDER — FENTANYL CITRATE (PF) 250 MCG/5ML IJ SOLN
INTRAMUSCULAR | Status: DC | PRN
  Administered 2024-04-09: 100 ug via INTRAVENOUS

## 2024-04-09 MED ORDER — SODIUM CHLORIDE 0.9 % IV SOLN: INTRAVENOUS | Status: DC

## 2024-04-09 MED ORDER — SUGAMMADEX SODIUM 200 MG/2ML IV SOLN
INTRAVENOUS | Status: DC | PRN
  Administered 2024-04-09: 140 mg via INTRAVENOUS

## 2024-04-09 MED ORDER — LIDOCAINE 2% (20 MG/ML) 5 ML SYRINGE
INTRAMUSCULAR | Status: DC | PRN
  Administered 2024-04-09: 40 mg via INTRAVENOUS

## 2024-04-09 MED ORDER — HEPARIN (PORCINE) IN NACL 1000-0.9 UT/500ML-% IV SOLN
INTRAVENOUS | Status: DC | PRN
  Administered 2024-04-09 (×2): 500 mL

## 2024-04-09 MED ORDER — PROPOFOL 500 MG/50ML IV EMUL
INTRAVENOUS | Status: DC | PRN
  Administered 2024-04-09: 85 ug/kg/min via INTRAVENOUS

## 2024-04-09 MED ORDER — DEXAMETHASONE SOD PHOSPHATE PF 10 MG/ML IJ SOLN
INTRAMUSCULAR | Status: DC | PRN
  Administered 2024-04-09: 4 mg via INTRAVENOUS

## 2024-04-09 MED ORDER — PHENYLEPHRINE HCL-NACL 20-0.9 MG/250ML-% IV SOLN
INTRAVENOUS | Status: DC | PRN
  Administered 2024-04-09: 20 ug/min via INTRAVENOUS

## 2024-04-09 MED ORDER — ONDANSETRON HCL 4 MG/2ML IJ SOLN
INTRAMUSCULAR | Status: DC | PRN
  Administered 2024-04-09: 4 mg via INTRAVENOUS

## 2024-04-09 MED ORDER — EPHEDRINE SULFATE-NACL 50-0.9 MG/10ML-% IV SOSY
PREFILLED_SYRINGE | INTRAVENOUS | Status: DC | PRN
  Administered 2024-04-09: 5 mg via INTRAVENOUS
  Administered 2024-04-09 (×2): 10 mg via INTRAVENOUS

## 2024-04-09 MED ORDER — HEPARIN SODIUM (PORCINE) 1000 UNIT/ML IJ SOLN
INTRAMUSCULAR | Status: DC | PRN
  Administered 2024-04-09: 13000 [IU] via INTRAVENOUS
  Administered 2024-04-09: 4000 [IU] via INTRAVENOUS

## 2024-04-09 MED ORDER — PROTAMINE SULFATE 10 MG/ML IV SOLN
INTRAVENOUS | Status: DC | PRN
  Administered 2024-04-09: 10 mg via INTRAVENOUS

## 2024-04-09 NOTE — Anesthesia Preprocedure Evaluation (Addendum)
 "                                  Anesthesia Evaluation  Patient identified by MRN, date of birth, ID band Patient awake    Reviewed: Allergy & Precautions, NPO status , Patient's Chart, lab work & pertinent test results  History of Anesthesia Complications (+) PONV and history of anesthetic complications  Airway Mallampati: II  TM Distance: >3 FB Neck ROM: Full    Dental no notable dental hx. (+) Partial Upper, Partial Lower   Pulmonary neg pulmonary ROS   Pulmonary exam normal        Cardiovascular hypertension, Pt. on medications and Pt. on home beta blockers  Rhythm:Irregular Rate:Normal     Neuro/Psych negative neurological ROS  negative psych ROS   GI/Hepatic Neg liver ROS,GERD  ,,  Endo/Other  negative endocrine ROS    Renal/GU negative Renal ROS  negative genitourinary   Musculoskeletal  (+) Arthritis , Osteoarthritis,    Abdominal Normal abdominal exam  (+)   Peds  Hematology Lab Results      Component                Value               Date                      WBC                      6.7                 03/20/2024                HGB                      14.6                03/20/2024                HCT                      45.0                03/20/2024                MCV                      97                  03/20/2024                PLT                      256                 03/20/2024             Lab Results      Component                Value               Date                      NA  140                 03/20/2024                K                        4.5                 03/20/2024                CO2                      22                  03/20/2024                GLUCOSE                  91                  03/20/2024                BUN                      11                  03/20/2024                CREATININE               0.81                03/20/2024                CALCIUM                   9.5                 03/20/2024                EGFR                     71                  03/20/2024                GFRNONAA                 >60                 12/13/2022              Anesthesia Other Findings   Reproductive/Obstetrics                              Anesthesia Physical Anesthesia Plan  ASA: 3  Anesthesia Plan: General   Post-op Pain Management:    Induction: Intravenous  PONV Risk Score and Plan: 4 or greater and Ondansetron , Dexamethasone  and Treatment may vary due to age or medical condition  Airway Management Planned: Mask and Oral ETT  Additional Equipment: None  Intra-op Plan:   Post-operative Plan: Extubation in OR  Informed Consent: I have reviewed the patients History and Physical, chart, labs and discussed the procedure including the risks, benefits and alternatives for the proposed anesthesia with the patient or authorized representative who has indicated his/her understanding and acceptance.  Dental advisory given  Plan Discussed with: CRNA  Anesthesia Plan Comments:          Anesthesia Quick Evaluation  "

## 2024-04-09 NOTE — Anesthesia Postprocedure Evaluation (Signed)
"   Anesthesia Post Note  Patient: Stephanie Ingram  Procedure(s) Performed: ATRIAL FIBRILLATION ABLATION     Patient location during evaluation: PACU Anesthesia Type: General Level of consciousness: awake and alert Pain management: pain level controlled Vital Signs Assessment: post-procedure vital signs reviewed and stable Respiratory status: spontaneous breathing, nonlabored ventilation, respiratory function stable and patient connected to nasal cannula oxygen Cardiovascular status: blood pressure returned to baseline and stable Postop Assessment: no apparent nausea or vomiting Anesthetic complications: no   There were no known notable events for this encounter.  Last Vitals:  Vitals:   04/09/24 1330 04/09/24 1345  BP: 104/62 99/63  Pulse: 80 79  Resp: 15 (!) 23  Temp:    SpO2: 94% 90%    Last Pain:  Vitals:   04/09/24 1345  TempSrc:   PainSc: 0-No pain                 Cordella P Jalyric Kaestner      "

## 2024-04-09 NOTE — Transfer of Care (Signed)
 Immediate Anesthesia Transfer of Care Note  Patient: Stephanie Ingram  Procedure(s) Performed: ATRIAL FIBRILLATION ABLATION  Patient Location: PACU and Cath Lab  Anesthesia Type:General  Level of Consciousness: awake, patient cooperative, and responds to stimulation  Airway & Oxygen Therapy: Patient Spontanous Breathing and Patient connected to face mask oxygen  Post-op Assessment: Report given to RN and Post -op Vital signs reviewed and stable  Post vital signs: Reviewed and stable  Last Vitals:  Vitals Value Taken Time  BP    Temp    Pulse 89 04/09/24 12:27  Resp 19 04/09/24 12:27  SpO2 100 % 04/09/24 12:27  Vitals shown include unfiled device data.  Last Pain:  Vitals:   04/09/24 0944  TempSrc: Oral         Complications: There were no known notable events for this encounter.

## 2024-04-09 NOTE — Anesthesia Procedure Notes (Signed)
 Procedure Name: Intubation Date/Time: 04/09/2024 10:59 AM  Performed by: Myrna Homer, CRNAPre-anesthesia Checklist: Patient identified, Emergency Drugs available, Suction available and Patient being monitored Patient Re-evaluated:Patient Re-evaluated prior to induction Oxygen Delivery Method: Circle System Utilized Preoxygenation: Pre-oxygenation with 100% oxygen Induction Type: IV induction Ventilation: Mask ventilation without difficulty Laryngoscope Size: Glidescope and 3 Grade View: Grade I Tube type: Oral Tube size: 7.0 mm Number of attempts: 1 Airway Equipment and Method: Stylet and Oral airway Placement Confirmation: ETT inserted through vocal cords under direct vision, positive ETCO2 and breath sounds checked- equal and bilateral Secured at: 21 cm Tube secured with: Tape Dental Injury: Teeth and Oropharynx as per pre-operative assessment

## 2024-04-09 NOTE — Progress Notes (Signed)
 Discharge instructions reviewed with patient and son at bedside. Denies questions concerns. PT tolerated PO intake. Ambulated in the hallway, was able to void without difficulty. Seen by MD incision site remains clean dry and intact. No s/s of complications. PT escorted from the unit via wheel chair to personal vehicle.

## 2024-04-09 NOTE — Interval H&P Note (Signed)
 History and Physical Interval Note:  04/09/2024 10:15 AM  Stephanie Ingram  has presented today for surgery, with the diagnosis of afib.  The various methods of treatment have been discussed with the patient and family. After consideration of risks, benefits and other options for treatment, the patient has consented to  Procedures: ATRIAL FIBRILLATION ABLATION (N/A) as a surgical intervention.  The patient's history has been reviewed, patient examined, no change in status, stable for surgery.  I have reviewed the patient's chart and labs.  Questions were answered to the patient's satisfaction.     Chelsea Pedretti [employer]

## 2024-04-09 NOTE — Discharge Instructions (Signed)

## 2024-04-10 ENCOUNTER — Telehealth (HOSPITAL_COMMUNITY): Payer: Self-pay

## 2024-04-10 MED FILL — Fentanyl Citrate Preservative Free (PF) Inj 100 MCG/2ML: INTRAMUSCULAR | Qty: 2 | Status: AC

## 2024-04-10 NOTE — Telephone Encounter (Signed)
 Spoke with patient to complete post procedure follow up call.  Patient reports no complications with groin sites.   Instructions reviewed with patient:  Remove large bandage at puncture site after 24 hours. It is normal to have bruising, tenderness, mild swelling, and a pea or marble sized lump/knot at the groin site which can take up to three months to resolve.  Get help right away if you notice sudden swelling at the puncture site.  Check your puncture site every day for signs of infection: fever, redness, swelling, pus drainage, warmth, foul odor or excessive pain. If this occurs, please call [phone], to speak with the RN Navigator. Get help right away if your puncture site is bleeding and the bleeding does not stop after applying firm pressure to the area.  You may continue to have skipped beats/ atrial fibrillation during the first several months after your procedure.  It is very important not to miss any doses of your blood thinner Xarelto .    You will follow up with the Afib clinic 4 weeks after your procedure and follow up with the Afib clinic 3 months after your procedure.  Activity restrictions reviewed.  Patient verbalized understanding to all instructions provided.

## 2024-05-06 ENCOUNTER — Other Ambulatory Visit: Payer: Self-pay | Admitting: Cardiology

## 2024-05-07 ENCOUNTER — Ambulatory Visit (HOSPITAL_COMMUNITY)
Admission: RE | Admit: 2024-05-07 | Discharge: 2024-05-07 | Disposition: A | Source: Ambulatory Visit | Attending: Nurse Practitioner | Admitting: Nurse Practitioner

## 2024-05-07 ENCOUNTER — Other Ambulatory Visit (HOSPITAL_COMMUNITY): Payer: Self-pay

## 2024-05-07 ENCOUNTER — Encounter (HOSPITAL_COMMUNITY): Payer: Self-pay | Admitting: Nurse Practitioner

## 2024-05-07 VITALS — BP 128/72 | HR 65 | Ht 65.0 in | Wt 146.2 lb

## 2024-05-07 DIAGNOSIS — Z79899 Other long term (current) drug therapy: Secondary | ICD-10-CM | POA: Diagnosis not present

## 2024-05-07 DIAGNOSIS — I480 Paroxysmal atrial fibrillation: Secondary | ICD-10-CM | POA: Diagnosis not present

## 2024-05-07 DIAGNOSIS — Z5181 Encounter for therapeutic drug level monitoring: Secondary | ICD-10-CM

## 2024-05-07 DIAGNOSIS — D6869 Other thrombophilia: Secondary | ICD-10-CM | POA: Diagnosis not present

## 2024-05-07 MED ORDER — FLECAINIDE ACETATE 50 MG PO TABS
50.0000 mg | ORAL_TABLET | Freq: Two times a day (BID) | ORAL | 6 refills | Status: AC
  Filled 2024-05-07: qty 60, 30d supply, fill #0

## 2024-07-08 ENCOUNTER — Ambulatory Visit (HOSPITAL_COMMUNITY): Admitting: Nurse Practitioner
